# Patient Record
Sex: Female | Born: 1990 | Race: Black or African American | Hispanic: No | Marital: Single | Smoking: Never smoker
Health system: Southern US, Community
[De-identification: ages and names within clinical notes are randomized; demographics above are authoritative.]

## PROBLEM LIST (undated history)

## (undated) ENCOUNTER — Inpatient Hospital Stay (HOSPITAL_COMMUNITY): Payer: Self-pay

## (undated) DIAGNOSIS — I10 Essential (primary) hypertension: Secondary | ICD-10-CM

## (undated) DIAGNOSIS — K3580 Unspecified acute appendicitis: Secondary | ICD-10-CM

## (undated) DIAGNOSIS — N858 Other specified noninflammatory disorders of uterus: Secondary | ICD-10-CM

## (undated) DIAGNOSIS — R011 Cardiac murmur, unspecified: Secondary | ICD-10-CM

## (undated) HISTORY — DX: Essential (primary) hypertension: I10

## (undated) HISTORY — PX: APPENDECTOMY: SHX54

## (undated) HISTORY — DX: Cardiac murmur, unspecified: R01.1

---

## 2008-07-06 ENCOUNTER — Emergency Department (HOSPITAL_COMMUNITY): Admission: EM | Admit: 2008-07-06 | Discharge: 2008-07-06 | Payer: Self-pay | Admitting: Emergency Medicine

## 2009-08-25 ENCOUNTER — Emergency Department (HOSPITAL_COMMUNITY): Admission: EM | Admit: 2009-08-25 | Discharge: 2009-08-25 | Payer: Self-pay | Admitting: Emergency Medicine

## 2011-04-30 LAB — RAPID URINE DRUG SCREEN, HOSP PERFORMED
Amphetamines: NOT DETECTED
Benzodiazepines: NOT DETECTED
Tetrahydrocannabinol: POSITIVE — AB

## 2011-04-30 LAB — URINALYSIS, ROUTINE W REFLEX MICROSCOPIC
Protein, ur: 100 mg/dL — AB
Urobilinogen, UA: 1 mg/dL (ref 0.0–1.0)

## 2011-04-30 LAB — URINE MICROSCOPIC-ADD ON

## 2011-04-30 LAB — POCT PREGNANCY, URINE: Preg Test, Ur: NEGATIVE

## 2013-05-16 ENCOUNTER — Inpatient Hospital Stay: Admit: 2013-05-16 | Payer: Self-pay | Admitting: General Surgery

## 2013-05-16 ENCOUNTER — Encounter (HOSPITAL_COMMUNITY): Payer: Self-pay | Admitting: Emergency Medicine

## 2013-05-16 ENCOUNTER — Encounter (HOSPITAL_COMMUNITY): Payer: BC Managed Care – PPO | Admitting: Anesthesiology

## 2013-05-16 ENCOUNTER — Emergency Department (HOSPITAL_COMMUNITY): Payer: BC Managed Care – PPO

## 2013-05-16 ENCOUNTER — Emergency Department (HOSPITAL_COMMUNITY): Payer: BC Managed Care – PPO | Admitting: Anesthesiology

## 2013-05-16 ENCOUNTER — Observation Stay (HOSPITAL_COMMUNITY)
Admission: EM | Admit: 2013-05-16 | Discharge: 2013-05-17 | Disposition: A | Discharge status: Home / Self Care | Payer: BC Managed Care – PPO | Attending: General Surgery | Admitting: General Surgery

## 2013-05-16 ENCOUNTER — Encounter (HOSPITAL_COMMUNITY)
Admission: EM | Disposition: A | Discharge status: Home / Self Care | Payer: Self-pay | Source: Home / Self Care | Attending: Emergency Medicine

## 2013-05-16 DIAGNOSIS — K3580 Unspecified acute appendicitis: Principal | ICD-10-CM | POA: Insufficient documentation

## 2013-05-16 DIAGNOSIS — N858 Other specified noninflammatory disorders of uterus: Secondary | ICD-10-CM | POA: Diagnosis present

## 2013-05-16 DIAGNOSIS — R1031 Right lower quadrant pain: Secondary | ICD-10-CM | POA: Insufficient documentation

## 2013-05-16 DIAGNOSIS — N9489 Other specified conditions associated with female genital organs and menstrual cycle: Secondary | ICD-10-CM | POA: Insufficient documentation

## 2013-05-16 HISTORY — DX: Unspecified acute appendicitis: K35.80

## 2013-05-16 HISTORY — PX: LAPAROSCOPIC APPENDECTOMY: SHX408

## 2013-05-16 HISTORY — DX: Other specified noninflammatory disorders of uterus: N85.8

## 2013-05-16 LAB — CBC WITH DIFFERENTIAL/PLATELET
Eosinophils Relative: 1 % (ref 0–5)
HCT: 39.5 % (ref 36.0–46.0)
Lymphocytes Relative: 30 % (ref 12–46)
Lymphs Abs: 3.6 10*3/uL (ref 0.7–4.0)
MCV: 70.9 fL — ABNORMAL LOW (ref 78.0–100.0)
Monocytes Relative: 6 % (ref 3–12)
Neutro Abs: 7.6 10*3/uL (ref 1.7–7.7)
Platelets: 279 10*3/uL (ref 150–400)
RBC: 5.57 MIL/uL — ABNORMAL HIGH (ref 3.87–5.11)
WBC: 12 10*3/uL — ABNORMAL HIGH (ref 4.0–10.5)

## 2013-05-16 LAB — COMPREHENSIVE METABOLIC PANEL
ALT: 24 U/L (ref 0–35)
AST: 30 U/L (ref 0–37)
Albumin: 4 g/dL (ref 3.5–5.2)
Alkaline Phosphatase: 44 U/L (ref 39–117)
CO2: 22 mEq/L (ref 19–32)
Chloride: 97 mEq/L (ref 96–112)
Creatinine, Ser: 0.91 mg/dL (ref 0.50–1.10)
GFR calc non Af Amer: 88 mL/min — ABNORMAL LOW (ref >90)
Potassium: 3.9 mEq/L (ref 3.5–5.1)
Total Bilirubin: 0.2 mg/dL — ABNORMAL LOW (ref 0.3–1.2)

## 2013-05-16 LAB — URINALYSIS, ROUTINE W REFLEX MICROSCOPIC
Bilirubin Urine: NEGATIVE
Glucose, UA: NEGATIVE mg/dL
Hgb urine dipstick: NEGATIVE
Ketones, ur: 40 mg/dL — AB
Protein, ur: NEGATIVE mg/dL
pH: 6 (ref 5.0–8.0)

## 2013-05-16 LAB — URINE MICROSCOPIC-ADD ON

## 2013-05-16 SURGERY — APPENDECTOMY, LAPAROSCOPIC
Anesthesia: General | Site: Abdomen | Wound class: Clean Contaminated

## 2013-05-16 MED ORDER — LACTATED RINGERS IV SOLN
INTRAVENOUS | Status: DC | PRN
  Administered 2013-05-16: 23:00:00 via INTRAVENOUS

## 2013-05-16 MED ORDER — DEXAMETHASONE SODIUM PHOSPHATE 10 MG/ML IJ SOLN
INTRAMUSCULAR | Status: DC | PRN
  Administered 2013-05-16: 10 mg via INTRAVENOUS

## 2013-05-16 MED ORDER — MORPHINE SULFATE 4 MG/ML IJ SOLN
4.0000 mg | Freq: Once | INTRAMUSCULAR | Status: AC
  Administered 2013-05-16: 4 mg via INTRAVENOUS
  Filled 2013-05-16: qty 1

## 2013-05-16 MED ORDER — IOHEXOL 300 MG/ML  SOLN
50.0000 mL | Freq: Once | INTRAMUSCULAR | Status: AC | PRN
  Administered 2013-05-16: 50 mL via ORAL

## 2013-05-16 MED ORDER — MIDAZOLAM HCL 5 MG/5ML IJ SOLN
INTRAMUSCULAR | Status: DC | PRN
  Administered 2013-05-16: 2 mg via INTRAVENOUS

## 2013-05-16 MED ORDER — SODIUM CHLORIDE 0.9 % IV BOLUS (SEPSIS)
1000.0000 mL | Freq: Once | INTRAVENOUS | Status: AC
  Administered 2013-05-16: 1000 mL via INTRAVENOUS

## 2013-05-16 MED ORDER — ONDANSETRON HCL 4 MG/2ML IJ SOLN
INTRAMUSCULAR | Status: DC | PRN
  Administered 2013-05-16: 4 mg via INTRAVENOUS

## 2013-05-16 MED ORDER — 0.9 % SODIUM CHLORIDE (POUR BTL) OPTIME
TOPICAL | Status: DC | PRN
  Administered 2013-05-16: 1000 mL

## 2013-05-16 MED ORDER — LIDOCAINE HCL (CARDIAC) 20 MG/ML IV SOLN
INTRAVENOUS | Status: DC | PRN
  Administered 2013-05-16: 100 mg via INTRAVENOUS

## 2013-05-16 MED ORDER — SODIUM CHLORIDE 0.9 % IV SOLN
INTRAVENOUS | Status: AC
  Filled 2013-05-16: qty 1

## 2013-05-16 MED ORDER — BUPIVACAINE-EPINEPHRINE PF 0.5-1:200000 % IJ SOLN
INTRAMUSCULAR | Status: AC
  Filled 2013-05-16: qty 30

## 2013-05-16 MED ORDER — SUCCINYLCHOLINE CHLORIDE 20 MG/ML IJ SOLN
INTRAMUSCULAR | Status: DC | PRN
  Administered 2013-05-16: 100 mg via INTRAVENOUS

## 2013-05-16 MED ORDER — LACTATED RINGERS IR SOLN
Status: DC | PRN
  Administered 2013-05-16: 1000 mL

## 2013-05-16 MED ORDER — FENTANYL CITRATE 0.05 MG/ML IJ SOLN
INTRAMUSCULAR | Status: DC | PRN
  Administered 2013-05-16: 50 ug via INTRAVENOUS
  Administered 2013-05-16: 100 ug via INTRAVENOUS
  Administered 2013-05-16 (×2): 50 ug via INTRAVENOUS

## 2013-05-16 MED ORDER — IOHEXOL 300 MG/ML  SOLN
100.0000 mL | Freq: Once | INTRAMUSCULAR | Status: AC | PRN
  Administered 2013-05-16: 100 mL via INTRAVENOUS

## 2013-05-16 MED ORDER — ROCURONIUM BROMIDE 100 MG/10ML IV SOLN
INTRAVENOUS | Status: DC | PRN
  Administered 2013-05-16: 20 mg via INTRAVENOUS

## 2013-05-16 MED ORDER — PROPOFOL 10 MG/ML IV BOLUS
INTRAVENOUS | Status: DC | PRN
  Administered 2013-05-16: 200 mg via INTRAVENOUS

## 2013-05-16 SURGICAL SUPPLY — 38 items
APPLIER CLIP 5 13 M/L LIGAMAX5 (MISCELLANEOUS)
APPLIER CLIP ROT 10 11.4 M/L (STAPLE)
CANISTER SUCTION 2500CC (MISCELLANEOUS) ×2 IMPLANT
CHLORAPREP W/TINT 26ML (MISCELLANEOUS) ×2 IMPLANT
CLIP APPLIE 5 13 M/L LIGAMAX5 (MISCELLANEOUS) IMPLANT
CLIP APPLIE ROT 10 11.4 M/L (STAPLE) IMPLANT
CLOTH BEACON ORANGE TIMEOUT ST (SAFETY) ×2 IMPLANT
CORD HIGH FREQUENCY UNIPOLAR (ELECTROSURGICAL) ×2 IMPLANT
CUTTER FLEX LINEAR 45M (STAPLE) ×2 IMPLANT
DECANTER SPIKE VIAL GLASS SM (MISCELLANEOUS) ×2 IMPLANT
DERMABOND ADVANCED (GAUZE/BANDAGES/DRESSINGS)
DERMABOND ADVANCED .7 DNX12 (GAUZE/BANDAGES/DRESSINGS) IMPLANT
DRAPE LAPAROSCOPIC ABDOMINAL (DRAPES) ×2 IMPLANT
DRAPE UTILITY XL STRL (DRAPES) ×2 IMPLANT
ELECT REM PT RETURN 9FT ADLT (ELECTROSURGICAL) ×2
ELECTRODE REM PT RTRN 9FT ADLT (ELECTROSURGICAL) ×1 IMPLANT
GLOVE BIO SURGEON STRL SZ 6.5 (GLOVE) ×2 IMPLANT
GLOVE BIOGEL PI IND STRL 7.0 (GLOVE) ×1 IMPLANT
GLOVE BIOGEL PI INDICATOR 7.0 (GLOVE) ×1
GOWN STRL REIN 2XL XLG LVL4 (GOWN DISPOSABLE) ×2 IMPLANT
GOWN STRL REIN XL XLG (GOWN DISPOSABLE) ×4 IMPLANT
IV LACTATED RINGERS 1000ML (IV SOLUTION) ×2 IMPLANT
KIT BASIN OR (CUSTOM PROCEDURE TRAY) ×2 IMPLANT
POUCH SPECIMEN RETRIEVAL 10MM (ENDOMECHANICALS) ×2 IMPLANT
RELOAD 45 VASCULAR/THIN (ENDOMECHANICALS) ×2 IMPLANT
RELOAD STAPLE TA45 3.5 REG BLU (ENDOMECHANICALS) ×2 IMPLANT
SCALPEL HARMONIC ACE (MISCELLANEOUS) IMPLANT
SET IRRIG TUBING LAPAROSCOPIC (IRRIGATION / IRRIGATOR) ×2 IMPLANT
SOLUTION ANTI FOG 6CC (MISCELLANEOUS) ×2 IMPLANT
SUT MNCRL AB 4-0 PS2 18 (SUTURE) ×2 IMPLANT
SUT VIC AB 4-0 PS2 27 (SUTURE) ×2 IMPLANT
TOWEL OR 17X26 10 PK STRL BLUE (TOWEL DISPOSABLE) ×2 IMPLANT
TRAY FOLEY CATH 14FRSI W/METER (CATHETERS) ×2 IMPLANT
TRAY LAP CHOLE (CUSTOM PROCEDURE TRAY) ×2 IMPLANT
TROCAR BLADELESS OPT 5 75 (ENDOMECHANICALS) ×4 IMPLANT
TROCAR XCEL 12X100 BLDLESS (ENDOMECHANICALS) IMPLANT
TROCAR XCEL BLUNT TIP 100MML (ENDOMECHANICALS) ×2 IMPLANT
TUBING INSUFFLATION 10FT LAP (TUBING) ×2 IMPLANT

## 2013-05-16 NOTE — ED Notes (Signed)
OR called that pt is now ready for transport, stated she will be sending for her shortly.

## 2013-05-16 NOTE — H&P (Addendum)
Annelise Mccoy is an 22 y.o. female.   Chief Complaint: RLQ abd pain HPI: Patient presents to the emergency department with pain in her lower abdomen since Sunday. She reports his pain is worse when she is lying down. She felt bloated on Sunday and Monday and tried to relieve this with laxatives. She did have several bowel movements but had no relief in her symptoms.  She denies any fevers, nausea, vomiting, diarrhea or constipation. She denies any urinary symptoms. She states that the pain is now localized to her right lower quadrant.  No past medical history  No past surgical history   No significant family history Social History:  reports that she has never smoked. She does not have any smokeless tobacco history on file. She reports that she drinks about 1.8 ounces of alcohol per week. She reports that she does not use illicit drugs.  Allergies: No Known Allergies   (Not in a hospital admission)  Results for orders placed during the hospital encounter of 05/16/13 (from the past 48 hour(s))  CBC WITH DIFFERENTIAL     Status: Abnormal   Collection Time    05/16/13  7:50 PM      Result Value Range   WBC 12.0 (*) 4.0 - 10.5 K/uL   RBC 5.57 (*) 3.87 - 5.11 MIL/uL   Hemoglobin 13.1  12.0 - 15.0 g/dL   HCT 40.9  81.1 - 91.4 %   MCV 70.9 (*) 78.0 - 100.0 fL   MCH 23.5 (*) 26.0 - 34.0 pg   MCHC 33.2  30.0 - 36.0 g/dL   RDW 78.2  95.6 - 21.3 %   Platelets 279  150 - 400 K/uL   Neutrophils Relative % 63  43 - 77 %   Lymphocytes Relative 30  12 - 46 %   Monocytes Relative 6  3 - 12 %   Eosinophils Relative 1  0 - 5 %   Basophils Relative 0  0 - 1 %   Neutro Abs 7.6  1.7 - 7.7 K/uL   Lymphs Abs 3.6  0.7 - 4.0 K/uL   Monocytes Absolute 0.7  0.1 - 1.0 K/uL   Eosinophils Absolute 0.1  0.0 - 0.7 K/uL   Basophils Absolute 0.0  0.0 - 0.1 K/uL   Smear Review MORPHOLOGY UNREMARKABLE    COMPREHENSIVE METABOLIC PANEL     Status: Abnormal   Collection Time    05/16/13  7:50 PM      Result Value  Range   Sodium 132 (*) 135 - 145 mEq/L   Potassium 3.9  3.5 - 5.1 mEq/L   Chloride 97  96 - 112 mEq/L   CO2 22  19 - 32 mEq/L   Glucose, Bld 87  70 - 99 mg/dL   BUN 13  6 - 23 mg/dL   Creatinine, Ser 0.86  0.50 - 1.10 mg/dL   Calcium 57.8  8.4 - 46.9 mg/dL   Total Protein 8.1  6.0 - 8.3 g/dL   Albumin 4.0  3.5 - 5.2 g/dL   AST 30  0 - 37 U/L   ALT 24  0 - 35 U/L   Alkaline Phosphatase 44  39 - 117 U/L   Total Bilirubin 0.2 (*) 0.3 - 1.2 mg/dL   GFR calc non Af Amer 88 (*) >90 mL/min   GFR calc Af Amer >90  >90 mL/min   Comment: (NOTE)     The eGFR has been calculated using the CKD EPI equation.  This calculation has not been validated in all clinical situations.     eGFR's persistently <90 mL/min signify possible Chronic Kidney     Disease.  LIPASE, BLOOD     Status: None   Collection Time    05/16/13  7:50 PM      Result Value Range   Lipase 28  11 - 59 U/L  URINALYSIS, ROUTINE W REFLEX MICROSCOPIC     Status: Abnormal   Collection Time    05/16/13  7:51 PM      Result Value Range   Color, Urine YELLOW  YELLOW   APPearance CLEAR  CLEAR   Specific Gravity, Urine 1.015  1.005 - 1.030   pH 6.0  5.0 - 8.0   Glucose, UA NEGATIVE  NEGATIVE mg/dL   Hgb urine dipstick NEGATIVE  NEGATIVE   Bilirubin Urine NEGATIVE  NEGATIVE   Ketones, ur 40 (*) NEGATIVE mg/dL   Protein, ur NEGATIVE  NEGATIVE mg/dL   Urobilinogen, UA 0.2  0.0 - 1.0 mg/dL   Nitrite NEGATIVE  NEGATIVE   Leukocytes, UA TRACE (*) NEGATIVE  URINE MICROSCOPIC-ADD ON     Status: None   Collection Time    05/16/13  7:51 PM      Result Value Range   Squamous Epithelial / LPF RARE  RARE   WBC, UA 0-2  <3 WBC/hpf   RBC / HPF 0-2  <3 RBC/hpf   Bacteria, UA RARE  RARE  POCT PREGNANCY, URINE     Status: None   Collection Time    05/16/13  7:58 PM      Result Value Range   Preg Test, Ur NEGATIVE  NEGATIVE   Comment:            THE SENSITIVITY OF THIS     METHODOLOGY IS >24 mIU/mL   Ct Abdomen Pelvis W  Contrast  05/16/2013   CLINICAL DATA:  Lower abdominal pain 3 days  EXAM: CT ABDOMEN AND PELVIS WITH CONTRAST  TECHNIQUE: Multidetector CT imaging of the abdomen and pelvis was performed using the standard protocol following bolus administration of intravenous contrast.  CONTRAST:  OMNIPAQUE IOHEXOL 300 MG/ML  SOLN  COMPARISON:  None.  FINDINGS: Image quality degraded by motion.  FINDINGS consistent with acute appendicitis. The appendix is edematous and dilated measuring 10 mm. Appendicolith is present in the tip of the appendix. The appendix projects medial to the cecum with the tip in the midline. No abscess or evidence of perforation. Minimal free fluid in the cul de sac.  Negative for bowel obstruction.  The lung bases are clear. Liver gallbladder and bile ducts are normal. Pancreas and spleen are normal.  IMPRESSION: Findings compatible with acute appendicitis with appendicolith in the tip of the appendix. Negative for abscess.   Electronically Signed   By: Marlan Palau M.D.   On: 05/16/2013 21:24    Review of Systems  Constitutional: Negative for fever and chills.  Respiratory: Negative for shortness of breath.   Cardiovascular: Negative for chest pain.  Gastrointestinal: Positive for abdominal pain. Negative for nausea, vomiting, diarrhea and constipation.  Genitourinary: Negative for dysuria, urgency and frequency.  Skin: Negative for rash.  Neurological: Negative for headaches.    Blood pressure 127/67, pulse 101, temperature 98.3 F (36.8 C), temperature source Oral, last menstrual period 05/02/2013, SpO2 97.00%. Physical Exam  Constitutional: She is oriented to person, place, and time. She appears well-developed and well-nourished. No distress.  HENT:  Head: Normocephalic and atraumatic.  Eyes:  Conjunctivae are normal. Pupils are equal, round, and reactive to light.  Neck: Normal range of motion.  Cardiovascular: Normal rate and regular rhythm.   Respiratory: Effort normal  and breath sounds normal. No respiratory distress.  GI: Soft. She exhibits no distension. There is tenderness. There is no rebound and no guarding.  RLQ  Musculoskeletal: Normal range of motion.  Neurological: She is alert and oriented to person, place, and time.  Skin: She is not diaphoretic.     Assessment/Plan Yanel Dombrosky is a 23 year old female who presents to the emergency department with right lower quadrant pain. A CT scan shows signs concerning for appendicitis. The risks and benefits of laparoscopic appendectomy were explained to the patient. The main risk include bleeding, infection, damage to adjacent structures, wound infection and hernia.  All questions were answered. I believe she understands this and has agreed to proceed with surgery.  Juandedios Dudash C. 05/16/2013, 10:22 PM

## 2013-05-16 NOTE — Anesthesia Preprocedure Evaluation (Addendum)
Anesthesia Evaluation  Patient identified by MRN, date of birth, ID band Patient awake    Reviewed: Allergy & Precautions, H&P , NPO status , Patient's Chart, lab work & pertinent test results  Airway Mallampati: II TM Distance: >3 FB Neck ROM: Full    Dental no notable dental hx.    Pulmonary neg pulmonary ROS,  breath sounds clear to auscultation  Pulmonary exam normal       Cardiovascular Exercise Tolerance: Good negative cardio ROS  Rhythm:Regular Rate:Normal     Neuro/Psych negative neurological ROS  negative psych ROS   GI/Hepatic negative GI ROS, Neg liver ROS,   Endo/Other  negative endocrine ROS  Renal/GU negative Renal ROS  negative genitourinary   Musculoskeletal negative musculoskeletal ROS (+)   Abdominal   Peds negative pediatric ROS (+)  Hematology negative hematology ROS (+)   Anesthesia Other Findings   Reproductive/Obstetrics Negative pregnancy test.                           Anesthesia Physical Anesthesia Plan  ASA: II and emergent  Anesthesia Plan: General   Post-op Pain Management:    Induction: Intravenous  Airway Management Planned: Oral ETT  Additional Equipment:   Intra-op Plan:   Post-operative Plan: Extubation in OR  Informed Consent: I have reviewed the patients History and Physical, chart, labs and discussed the procedure including the risks, benefits and alternatives for the proposed anesthesia with the patient or authorized representative who has indicated his/her understanding and acceptance.   Dental advisory given  Plan Discussed with: CRNA  Anesthesia Plan Comments:         Anesthesia Quick Evaluation

## 2013-05-16 NOTE — ED Notes (Signed)
x2 unsuccessful IV attempts, another RN to attempt IV at this time

## 2013-05-16 NOTE — ED Provider Notes (Signed)
CSN: [id number]     Arrival date & time 05/16/13  1849 History   First MD Initiated Contact with Patient 05/16/13 1922     Chief Complaint  Patient presents with  . Abdominal Pain   (Consider location/radiation/quality/duration/timing/severity/associated sxs/prior Treatment) HPI Comments: 22 y/o woman comes in with cc of abd pain. Abd pain x 3 days, now constant, diffusely located, but worst right below the umbilicus and right side. The pain is non radiating, and not around the flank area. There is no emesis, no diarrhea, no uti like sx, no hx of renal stones and no vaginal d/c or bleeding.  Patient is a 22 y.o. female presenting with abdominal pain. The history is provided by the patient.  Abdominal Pain Associated symptoms: no chest pain, no dysuria, no nausea, no shortness of breath, no vaginal bleeding, no vaginal discharge and no vomiting     No past medical history on file. No past surgical history on file. No family history on file. History  Substance Use Topics  . Smoking status: Never Smoker   . Smokeless tobacco: Not on file  . Alcohol Use: 1.8 oz/week    3 Cans of beer per week   OB History   Grav Para Term Preterm Abortions TAB SAB Ect Mult Living                 Review of Systems  Constitutional: Negative for activity change.  Respiratory: Negative for shortness of breath.   Cardiovascular: Negative for chest pain.  Gastrointestinal: Positive for abdominal pain. Negative for nausea and vomiting.  Genitourinary: Negative for dysuria, frequency, flank pain, vaginal bleeding, vaginal discharge and pelvic pain.  Musculoskeletal: Negative for neck pain.  Neurological: Negative for headaches.    Allergies  Review of patient's allergies indicates no known allergies.  Home Medications   Current Outpatient Rx  Name  Route  Sig  Dispense  Refill  . calcium carbonate (TUMS - DOSED IN MG ELEMENTAL CALCIUM) 500 MG chewable tablet   Oral   Chew 1 tablet by mouth  daily.         . diphenhydrAMINE (BENADRYL) 25 mg capsule   Oral   Take 25 mg by mouth every 6 (six) hours as needed for itching.         Marland Kitchen ibuprofen (ADVIL,MOTRIN) 200 MG tablet   Oral   Take 200 mg by mouth every 6 (six) hours as needed for pain.         Marland Kitchen norethindrone-ethinyl estradiol (JUNEL FE,GILDESS FE,LOESTRIN FE) 1-20 MG-MCG tablet   Oral   Take 1 tablet by mouth daily.         . Sennosides (EX-LAX PO)   Oral   Take by mouth.          BP 144/83  Pulse 72  Temp(Src) 98.3 F (36.8 C) (Oral)  SpO2 100%  LMP 05/02/2013 Physical Exam  Nursing note and vitals reviewed. Constitutional: She is oriented to person, place, and time. She appears well-developed and well-nourished.  HENT:  Head: Normocephalic and atraumatic.  Eyes: EOM are normal. Pupils are equal, round, and reactive to light.  Neck: Neck supple.  Cardiovascular: Normal rate, regular rhythm and normal heart sounds.   No murmur heard. Pulmonary/Chest: Effort normal. No respiratory distress.  Abdominal: Soft. She exhibits no distension. There is tenderness. There is no rebound and no guarding.  RLQ, suprapubic and LLQ tenderness, with voluntary guarding, no rebound tenderness  Neurological: She is alert and oriented to person, place,  and time.  Skin: Skin is warm and dry.    ED Course  Procedures (including critical care time) Labs Review Labs Reviewed  CBC WITH DIFFERENTIAL - Abnormal; Notable for the following:    WBC 12.0 (*)    RBC 5.57 (*)    MCV 70.9 (*)    MCH 23.5 (*)    All other components within normal limits  COMPREHENSIVE METABOLIC PANEL - Abnormal; Notable for the following:    Sodium 132 (*)    Total Bilirubin 0.2 (*)    GFR calc non Af Amer 88 (*)    All other components within normal limits  URINALYSIS, ROUTINE W REFLEX MICROSCOPIC - Abnormal; Notable for the following:    Ketones, ur 40 (*)    Leukocytes, UA TRACE (*)    All other components within normal limits   LIPASE, BLOOD  URINE MICROSCOPIC-ADD ON  POCT PREGNANCY, URINE   Imaging Review No results found.  EKG Interpretation   None       MDM  No diagnosis found.  Pt comes in with cc of abd pain. Pt has lower quadrant pain, no vaginal d/c, bleeding and no uti like sx. She has no hx of STD, and has no risk factors at this time. Exam reveals diffse lower quadrant tenderness with guarding but no rebound. Possible appendicitis, or ruptured appy given pain is diffuse and there eis guarding. CT ordered - which if negative, we will get pelvic exam. No concerns for ovarian torsion for this pain x 3 days.  Derwood Kaplan, MD 05/16/13 2120

## 2013-05-16 NOTE — ED Notes (Signed)
Pt states that she started having lower abdominal pain 3 days ago. Denies N/V/D.

## 2013-05-16 NOTE — ED Notes (Signed)
MD at bedside at this time, speaking to pt

## 2013-05-17 ENCOUNTER — Encounter: Payer: Self-pay | Admitting: General Surgery

## 2013-05-17 ENCOUNTER — Encounter (HOSPITAL_COMMUNITY): Payer: Self-pay | Admitting: *Deleted

## 2013-05-17 DIAGNOSIS — N858 Other specified noninflammatory disorders of uterus: Secondary | ICD-10-CM

## 2013-05-17 DIAGNOSIS — K3580 Unspecified acute appendicitis: Secondary | ICD-10-CM | POA: Diagnosis present

## 2013-05-17 HISTORY — DX: Unspecified acute appendicitis: K35.80

## 2013-05-17 HISTORY — DX: Other specified noninflammatory disorders of uterus: N85.8

## 2013-05-17 MED ORDER — BUPIVACAINE-EPINEPHRINE 0.5% -1:200000 IJ SOLN
INTRAMUSCULAR | Status: DC | PRN
  Administered 2013-05-17: 11 mL

## 2013-05-17 MED ORDER — ONDANSETRON HCL 4 MG PO TABS: 4.0000 mg | ORAL_TABLET | Freq: Four times a day (QID) | ORAL | Status: DC | PRN

## 2013-05-17 MED ORDER — PROMETHAZINE HCL 25 MG/ML IJ SOLN: 6.2500 mg | INTRAMUSCULAR | Status: DC | PRN

## 2013-05-17 MED ORDER — OXYCODONE-ACETAMINOPHEN 5-325 MG PO TABS
1.0000 | ORAL_TABLET | ORAL | Status: DC | PRN
  Administered 2013-05-17: 1 via ORAL
  Administered 2013-05-17: 2 via ORAL
  Administered 2013-05-17: 1 via ORAL
  Filled 2013-05-17 (×2): qty 1
  Filled 2013-05-17: qty 2

## 2013-05-17 MED ORDER — HYDROMORPHONE HCL PF 1 MG/ML IJ SOLN
0.2500 mg | INTRAMUSCULAR | Status: DC | PRN
  Administered 2013-05-17 (×2): 0.5 mg via INTRAVENOUS

## 2013-05-17 MED ORDER — FENTANYL CITRATE 0.05 MG/ML IJ SOLN: 25.0000 ug | INTRAMUSCULAR | Status: DC | PRN

## 2013-05-17 MED ORDER — IBUPROFEN 200 MG PO TABS: ORAL_TABLET | ORAL | Status: DC

## 2013-05-17 MED ORDER — SODIUM CHLORIDE 0.9 % IV SOLN
INTRAVENOUS | Status: DC
  Administered 2013-05-17: 01:00:00 via INTRAVENOUS

## 2013-05-17 MED ORDER — MORPHINE SULFATE 2 MG/ML IJ SOLN
2.0000 mg | INTRAMUSCULAR | Status: DC | PRN
  Administered 2013-05-17: 2 mg via INTRAVENOUS
  Filled 2013-05-17: qty 1

## 2013-05-17 MED ORDER — SODIUM CHLORIDE 0.9 % IV SOLN
1.0000 g | INTRAVENOUS | Status: DC | PRN
  Administered 2013-05-16: 1 g via INTRAVENOUS

## 2013-05-17 MED ORDER — GLYCOPYRROLATE 0.2 MG/ML IJ SOLN
INTRAMUSCULAR | Status: DC | PRN
  Administered 2013-05-17: 0.6 mg via INTRAVENOUS

## 2013-05-17 MED ORDER — OXYCODONE-ACETAMINOPHEN 5-325 MG PO TABS: 1.0000 | ORAL_TABLET | ORAL | Status: DC | PRN

## 2013-05-17 MED ORDER — ENOXAPARIN SODIUM 40 MG/0.4ML ~~LOC~~ SOLN
40.0000 mg | SUBCUTANEOUS | Status: DC
  Filled 2013-05-17: qty 0.4

## 2013-05-17 MED ORDER — ACETAMINOPHEN 325 MG PO TABS: 650.0000 mg | ORAL_TABLET | Freq: Four times a day (QID) | ORAL | Status: DC | PRN

## 2013-05-17 MED ORDER — ONDANSETRON HCL 4 MG/2ML IJ SOLN: 4.0000 mg | Freq: Four times a day (QID) | INTRAMUSCULAR | Status: DC | PRN

## 2013-05-17 MED ORDER — NEOSTIGMINE METHYLSULFATE 1 MG/ML IJ SOLN
INTRAMUSCULAR | Status: DC | PRN
  Administered 2013-05-17: 4 mg via INTRAVENOUS

## 2013-05-17 MED ORDER — HYDROMORPHONE HCL PF 1 MG/ML IJ SOLN
INTRAMUSCULAR | Status: AC
  Filled 2013-05-17: qty 1

## 2013-05-17 NOTE — Op Note (Signed)
Casey Schroeder [id number]   PRE-OPERATIVE DIAGNOSIS:  acute appendicitis  POST-OPERATIVE DIAGNOSIS:  acute appendicitis, uterine mass  PROCEDURE:  Procedure(s): APPENDECTOMY LAPAROSCOPIC Biopsy of Uterine mass  SURGEON:  Surgeon(s): Romie Levee, MD  ASSISTANT: none   ANESTHESIA:   local and general  EBL:   10ml  Delay start of Pharmacological VTE agent (>24hrs) due to surgical blood loss or risk of bleeding:  no  DRAINS: none   SPECIMEN:  Source of Specimen:  appendix, uterine mass  DISPOSITION OF SPECIMEN:  PATHOLOGY  COUNTS:  YES  PLAN OF CARE: Admit for overnight observation  PATIENT DISPOSITION:  PACU - hemodynamically stable.   INDICATIONS: Patient with concerning symptoms & work up suspicious for appendicitis.  Surgery was recommended:  The anatomy & physiology of the digestive tract was discussed.  The pathophysiology of appendicitis was discussed.  Natural history risks without surgery was discussed.   I feel the risks of no intervention will lead to serious problems that outweigh the operative risks; therefore, I recommended diagnostic laparoscopy with removal of appendix to remove the pathology.  Laparoscopic & open techniques were discussed.   I noted a good likelihood this will help address the problem.    Risks such as bleeding, infection, abscess, leak, reoperation, possible ostomy, hernia, heart attack, death, and other risks were discussed.  Goals of post-operative recovery were discussed as well.  We will work to minimize complications.  Questions were answered.  The patient expresses understanding & wishes to proceed with surgery.  OR FINDINGS: the patient had an acutely inflamed appendix distally.  There was a uterine mass noted, which was biopsied.  DESCRIPTION:   The patient was identified & brought into the operating room. The patient was positioned supine with left arm tucked. SCDs were active during the entire case. The patient underwent general  anesthesia without any difficulty.  A foley catheter was inserted under sterile conditions. The abdomen was prepped and draped in a sterile fashion. A Surgical Timeout confirmed our plan.   I made a transverse incision through the inferior umbilical fold.  I made a nick in the infraumbilical fascia and confirmed peritoneal entry.  I placed a stay suture and then the [facility] port.  We induced carbon dioxide insufflation.  Camera inspection revealed no injury.  I placed additional ports under direct laparoscopic visualization.  I mobilized the terminal ileum to proximal ascending colon in a lateral to medial fashion.  I took care to avoid injuring any retroperitoneal structures.   I freed the appendix off its attachments to the ascending colon and cecal mesentery.  I elevated the appendix. I skeletonized & ligated the mesoappendix using a white load stapler. I was able to free off the base of the appendix which was still viable.  I stapled the appendix off the cecum using a laparoscopic stapler blue load. I took a healthy cuff viable cecum.  I placed the appendix inside an EndoCatch bag and removed out the [place] port.  I did copious irrigation. Hemostasis was good in the mesoappendix, colon mesentery, and retroperitoneum. Staple line was intact on the cecum with no bleeding. I washed out the pelvis, retrohepatic space and right paracolic gutter. I washed out the left side as well.  Hemostasis is good. There was no perforation or injury.  Because the area cleaned up well after irrigation, I did not place a drain.  I turned my attention to the uterine mass. We took a laparoscopic picture. This did not appear to be a standard  fibroid tumor. I decided to take a small biopsy using the biopsy forceps. Hemostasis was achieved using electrocautery.  I aspirated the carbon dioxide. I removed the ports. I closed the umbilical fascia site using a 0 Vicryl stitch. I closed skin using 4-0 vicryl stitch.  Sterile  dressings were applied.  Patient was extubated and sent to the recovery room.  I discussed the operative findings with the patient's family. I suspect the patient is going used in the hospital at least overnight and will need antibiotics for 0 days. Questions answered. They expressed understanding and appreciation.

## 2013-05-17 NOTE — Care Management Note (Signed)
    Page 1 of 1   05/17/2013     11:57:27 AM   CARE MANAGEMENT NOTE 05/17/2013  Patient:  Casey Schroeder   Account Number:  [id number]  Date Initiated:  05/17/2013  Documentation initiated by:  Lorenda Ishihara  Subjective/Objective Assessment:   22 yo female admitted s/p lap appy and uterine biopsy. PTA lived at home with spouse.     Action/Plan:   Home when stable   Anticipated DC Date:  05/17/2013   Anticipated DC Plan:  HOME/SELF CARE      DC Planning Services  CM consult      Choice offered to / List presented to:             Status of service:  Completed, signed off Medicare Important Message given?   (If response is "NO", the following Medicare IM given date fields will be blank) Date Medicare IM given:   Date Additional Medicare IM given:    Discharge Disposition:  HOME/SELF CARE  Per UR Regulation:  Reviewed for med. necessity/level of care/duration of stay  If discussed at Long Length of Stay Meetings, dates discussed:    Comments:

## 2013-05-17 NOTE — Discharge Summary (Signed)
I have interviewed and examined this patient today. I agree with the assessment and discharge planning and followup outlined by Mr. Marlyne Beards, [place]  The patient will followup with Dr. Romie Levee in the office.  Angelia Mould. Derrell Lolling, M.D., [facility] General and Minimally invasive Surgery Breast and Colorectal Surgery Office:   [phone] Pager:   [phone]

## 2013-05-17 NOTE — Discharge Summary (Signed)
Physician Discharge Summary  Patient ID: Casey Schroeder MRN: [id number] DOB/AGE: 1991/05/01 22 y.o.  Admit date: 05/16/2013 Discharge date: 05/17/2013  Admission Diagnoses: acute appendicitis  Discharge Diagnoses: acute appendicitis, uterine mass  Active Problems:   Appendicitis, acute   Uterine mass   PROCEDURES:  APPENDECTOMY LAPAROSCOPIC, Biopsy of Uterine mass,05/17/2013, Romie Levee, MD.   Hospital Course: Patient presents to the emergency department with pain in her lower abdomen since Sunday. She reports his pain is worse when she is lying down. She felt bloated on Sunday and Monday and tried to relieve this with laxatives. She did have several bowel movements but had no relief in her symptoms. She denies any fevers, nausea, vomiting, diarrhea or constipation. She denies any urinary symptoms. She states that the pain is now localized to her right lower quadrant.  CT scan showed acute appendicitis, she was seen by Dr. Maisie Fus and taken to the OR that evening. She underwent the above procedure, with a mass noted.  She underwent the above procedure and returned to the floor. She has done well post op and we plan to let her go home today.  Path will be reviewed and Dr. Maisie Fus will talk with her when that is available.   She will follow up with Dr. Maisie Fus in 2 weeks in our office.   Condition on d/c:  Improving.   Disposition: Final discharge disposition not confirmed     Medication List         acetaminophen 325 MG tablet  Commonly known as:  TYLENOL  Take 2 tablets (650 mg total) by mouth every 6 (six) hours as needed for pain.     calcium carbonate 500 MG chewable tablet  Commonly known as:  TUMS - dosed in mg elemental calcium  Chew 1 tablet by mouth daily.     diphenhydrAMINE 25 mg capsule  Commonly known as:  BENADRYL  Take 25 mg by mouth every 6 (six) hours as needed for itching.     EX-LAX PO  Take by mouth.     ibuprofen 200 MG tablet  Commonly known as:   ADVIL,MOTRIN  You can take 2-3 tablets every 6 hours as needed for pain.     norethindrone-ethinyl estradiol 1-20 MG-MCG tablet  Commonly known as:  JUNEL FE,GILDESS FE,LOESTRIN FE  Take 1 tablet by mouth daily.     oxyCODONE-acetaminophen 5-325 MG per tablet  Commonly known as:  PERCOCET/ROXICET  Take 1-2 tablets by mouth every 4 (four) hours as needed.       Follow-up Information   Follow up with Vanita Panda., MD.   Specialty:  General Surgery   Contact information:   [address] [place] [place] [address] [phone]       Signed: Sherrie George 05/17/2013, 12:55 PM

## 2013-05-17 NOTE — Progress Notes (Signed)
1 Day Post-Op  Subjective: Walking some but sore, ate some breakfast.  Pretty sore this AM.  Objective: Vital signs in last 24 hours: Temp:  [97.4 F (36.3 C)-99 F (37.2 C)] 98.5 F (36.9 C) (10/23 0600) Pulse Rate:  [63-101] 82 (10/23 0600) Resp:  [10-20] 16 (10/23 0600) BP: (110-144)/(58-83) 116/62 mmHg (10/23 0600) SpO2:  [97 %-100 %] 100 % (10/23 0600) Weight:  [87.998 kg (194 lb)] 87.998 kg (194 lb) (10/23 0158) Last BM Date: 05/16/13 240 PO, Regular Diet Tm 99, VSS,   Intake/Output from previous day: 10/22 0701 - 10/23 0700 In: 1665 [P.O.:240; I.V.:1425] Out: 400 [Urine:400] Intake/Output this shift:    General appearance: alert, cooperative and no distress GI: sore, incisions look fine, tolerating PO's.  Lab Results:   Recent Labs  05/16/13 1950  WBC 12.0*  HGB 13.1  HCT 39.5  PLT 279    BMET  Recent Labs  05/16/13 1950  NA 132*  K 3.9  CL 97  CO2 22  GLUCOSE 87  BUN 13  CREATININE 0.91  CALCIUM 10.1   PT/INR No results found for this basename: LABPROT, INR,  in the last 72 hours   Recent Labs Lab 05/16/13 1950  AST 30  ALT 24  ALKPHOS 44  BILITOT 0.2*  PROT 8.1  ALBUMIN 4.0     Lipase     Component Value Date/Time   LIPASE 28 05/16/2013 1950     Studies/Results: Ct Abdomen Pelvis W Contrast  05/16/2013   CLINICAL DATA:  Lower abdominal pain 3 days  EXAM: CT ABDOMEN AND PELVIS WITH CONTRAST  TECHNIQUE: Multidetector CT imaging of the abdomen and pelvis was performed using the standard protocol following bolus administration of intravenous contrast.  CONTRAST:  OMNIPAQUE IOHEXOL 300 MG/ML  SOLN  COMPARISON:  None.  FINDINGS: Image quality degraded by motion.  FINDINGS consistent with acute appendicitis. The appendix is edematous and dilated measuring 10 mm. Appendicolith is present in the tip of the appendix. The appendix projects medial to the cecum with the tip in the midline. No abscess or evidence of perforation. Minimal  free fluid in the cul de sac.  Negative for bowel obstruction.  The lung bases are clear. Liver gallbladder and bile ducts are normal. Pancreas and spleen are normal.  IMPRESSION: Findings compatible with acute appendicitis with appendicolith in the tip of the appendix. Negative for abscess.   Electronically Signed   By: Marlan Palau M.D.   On: 05/16/2013 21:24    Medications: . enoxaparin (LOVENOX) injection  40 mg Subcutaneous Q24H  . HYDROmorphone        Assessment/Plan acute appendicitis, uterine mass APPENDECTOMY LAPAROSCOPIC, Biopsy of Uterine mass,05/17/2013, Romie Levee, MD.   Plan:  If she does well home after lunch.   Follow up with Dr. Maisie Fus.       LOS: 1 day    Casey Schroeder 05/17/2013

## 2013-05-17 NOTE — Transfer of Care (Signed)
Immediate Anesthesia Transfer of Care Note  Patient: Casey Schroeder  Procedure(s) Performed: Procedure(s): APPENDECTOMY LAPAROSCOPIC uterine biopsy (N/A)  Patient Location: PACU  Anesthesia Type:General  Level of Consciousness: sedated  Airway & Oxygen Therapy: Patient Spontanous Breathing and Patient connected to face mask oxygen  Post-op Assessment: Report given to PACU RN and Post -op Vital signs reviewed and stable  Post vital signs: Reviewed and stable  Complications: No apparent anesthesia complications

## 2013-05-17 NOTE — Anesthesia Postprocedure Evaluation (Signed)
  Anesthesia Post-op Note  Patient: Casey Schroeder  Procedure(s) Performed: Procedure(s) (LRB): APPENDECTOMY LAPAROSCOPIC uterine biopsy (N/A)  Patient Location: PACU  Anesthesia Type: General  Level of Consciousness: awake and alert   Airway and Oxygen Therapy: Patient Spontanous Breathing  Post-op Pain: mild  Post-op Assessment: Post-op Vital signs reviewed, Patient's Cardiovascular Status Stable, Respiratory Function Stable, Patent Airway and No signs of Nausea or vomiting  Last Vitals:  Filed Vitals:   05/17/13 0030  BP:   Pulse:   Temp: 36.3 C    Post-op Vital Signs: stable   Complications: No apparent anesthesia complications

## 2013-05-17 NOTE — Progress Notes (Signed)
I have interviewed and evaluated and examined this patient this morning. I agree with the assessment and treatment plan outlined by Mr. Marlyne Beards, [place].  I did discuss the operative findings with her and the need to followup in the office with Dr. Maisie Fus to discuss the pathology report of the appendix and the uterine biopsy.  Angelia Mould. Derrell Lolling, M.D., [facility] General and Minimally invasive Surgery Breast and Colorectal Surgery Office:   [phone] Pager:   [phone]

## 2013-05-18 ENCOUNTER — Telehealth (INDEPENDENT_AMBULATORY_CARE_PROVIDER_SITE_OTHER): Payer: Self-pay

## 2013-05-18 NOTE — Telephone Encounter (Signed)
I called and left a message for the pt to call.  I was calling to check on her and see how she is postop and to schedule a follow up appointment.  Dr Maisie Fus has time on 11/10

## 2013-06-04 ENCOUNTER — Ambulatory Visit (INDEPENDENT_AMBULATORY_CARE_PROVIDER_SITE_OTHER): Payer: BC Managed Care – PPO | Admitting: General Surgery

## 2013-06-04 ENCOUNTER — Encounter (INDEPENDENT_AMBULATORY_CARE_PROVIDER_SITE_OTHER): Payer: Self-pay | Admitting: General Surgery

## 2013-06-04 ENCOUNTER — Encounter (INDEPENDENT_AMBULATORY_CARE_PROVIDER_SITE_OTHER): Payer: Self-pay

## 2013-06-04 VITALS — BP 106/62 | HR 72 | Temp 98.2°F | Resp 15 | Ht 64.0 in | Wt 197.4 lb

## 2013-06-04 DIAGNOSIS — Z9889 Other specified postprocedural states: Secondary | ICD-10-CM

## 2013-06-04 NOTE — Progress Notes (Signed)
Casey Schroeder is a 22 y.o. female who is status post a lap appendectomy on 10/23.  She is doing well.  She is having a small amt of irritation at her umbilical incision.  She is having regular BM's and tolerating a diet fine.    Objective: Filed Vitals:   06/04/13 1502  BP: 106/62  Pulse: 72  Temp: 98.2 F (36.8 C)  Resp: 15    General appearance: alert and cooperative GI: normal findings: soft, non-tender  Incision: healing well   Assessment: s/p  Patient Active Problem List   Diagnosis Date Noted  . Appendicitis, acute 05/17/2013  . Uterine mass 05/17/2013    Plan: Reviewed path.  PT to discuss external uterine fibroid with her Gyn.  Ok to return to activity.  F/U PRN    .Vanita Panda, MD [facility], [place] [phone]   06/04/2013 3:17 PM

## 2013-06-04 NOTE — Patient Instructions (Signed)
Ok to increase your activity level.  Hold off on heavy lifting until 8 weeks after surgery.  Talk to your gynecologist about your external uterine fibroid.

## 2013-06-06 ENCOUNTER — Encounter: Payer: Self-pay | Admitting: Diagnostic Radiology

## 2014-06-29 ENCOUNTER — Encounter (HOSPITAL_COMMUNITY): Payer: Self-pay

## 2014-06-29 ENCOUNTER — Inpatient Hospital Stay (HOSPITAL_COMMUNITY)
Admission: AD | Admit: 2014-06-29 | Discharge: 2014-06-29 | Disposition: A | Discharge status: Home / Self Care | Payer: BC Managed Care – PPO | Source: Ambulatory Visit | Attending: Family Medicine | Admitting: Family Medicine

## 2014-06-29 ENCOUNTER — Inpatient Hospital Stay (HOSPITAL_COMMUNITY): Payer: BC Managed Care – PPO

## 2014-06-29 DIAGNOSIS — Z3A01 Less than 8 weeks gestation of pregnancy: Secondary | ICD-10-CM | POA: Diagnosis not present

## 2014-06-29 DIAGNOSIS — R58 Hemorrhage, not elsewhere classified: Secondary | ICD-10-CM

## 2014-06-29 DIAGNOSIS — O209 Hemorrhage in early pregnancy, unspecified: Secondary | ICD-10-CM | POA: Insufficient documentation

## 2014-06-29 LAB — URINALYSIS, ROUTINE W REFLEX MICROSCOPIC
BILIRUBIN URINE: NEGATIVE
Glucose, UA: NEGATIVE mg/dL
Ketones, ur: NEGATIVE mg/dL
Leukocytes, UA: NEGATIVE
NITRITE: NEGATIVE
PH: 6 (ref 5.0–8.0)
Protein, ur: NEGATIVE mg/dL
SPECIFIC GRAVITY, URINE: 1.02 (ref 1.005–1.030)
Urobilinogen, UA: 0.2 mg/dL (ref 0.0–1.0)

## 2014-06-29 LAB — ABO/RH: ABO/RH(D): B POS

## 2014-06-29 LAB — CBC
HEMATOCRIT: 39.3 % (ref 36.0–46.0)
Hemoglobin: 12.8 g/dL (ref 12.0–15.0)
MCH: 23.8 pg — AB (ref 26.0–34.0)
MCHC: 32.6 g/dL (ref 30.0–36.0)
MCV: 73.2 fL — ABNORMAL LOW (ref 78.0–100.0)
Platelets: 150 10*3/uL (ref 150–400)
RBC: 5.37 MIL/uL — ABNORMAL HIGH (ref 3.87–5.11)
RDW: 14.9 % (ref 11.5–15.5)
WBC: 9.4 10*3/uL (ref 4.0–10.5)

## 2014-06-29 LAB — POCT PREGNANCY, URINE: Preg Test, Ur: POSITIVE — AB

## 2014-06-29 LAB — WET PREP, GENITAL
Trich, Wet Prep: NONE SEEN
YEAST WET PREP: NONE SEEN

## 2014-06-29 LAB — URINE MICROSCOPIC-ADD ON

## 2014-06-29 LAB — RPR

## 2014-06-29 LAB — HCG, QUANTITATIVE, PREGNANCY: HCG, BETA CHAIN, QUANT, S: 1945 m[IU]/mL — AB (ref <5)

## 2014-06-29 LAB — HIV ANTIBODY (ROUTINE TESTING W REFLEX): HIV: NONREACTIVE

## 2014-06-29 NOTE — MAU Note (Signed)
Pt states here for early pregnancy bleeding that began around 0800 this am. Last intercourse 2-3 weeks ago. Did see one dark red blood clot in shower this am. No pain. Very little bleeding, only seen when wiping post voiding.

## 2014-06-29 NOTE — MAU Provider Note (Signed)
History     CSN: [id number]  Arrival date and time: 06/29/14 1328   None     Chief Complaint  Patient presents with  . Vaginal Bleeding   HPIpt is G1p0 at [redacted]w[redacted]d pregnant and presents with dark red blood noticed this morning when went to the bathroom. Pt denies pain, nausea, vomiting, diarrhea or UTI sx or other vaginal discharge  RN note: Joselyn Glassman, RN Registered Nurse Signed  MAU Note 06/29/2014 1:51 PM    Expand All Collapse All   Pt states here for early pregnancy bleeding that began around 0800 this am. Last intercourse 2-3 weeks ago. Did see one dark red blood clot in shower this am. No pain. Very little bleeding, only seen when wiping post voiding.        Past Medical History  Diagnosis Date  . Appendicitis, acute 05/17/2013  . Uterine mass 05/17/2013  . Heart murmur     Past Surgical History  Procedure Laterality Date  . Laparoscopic appendectomy N/A 05/16/2013    Procedure: APPENDECTOMY LAPAROSCOPIC uterine biopsy;  Surgeon: Leighton Ruff, MD;  Location: [facility];  Service: General;  Laterality: N/A;    Family History  Problem Relation Age of Onset  . Cancer Maternal Aunt     breast    History  Substance Use Topics  . Smoking status: Never Smoker   . Smokeless tobacco: Never Used  . Alcohol Use: 1.8 oz/week    3 Cans of beer per week    Allergies: No Known Allergies  Prescriptions prior to admission  Medication Sig Dispense Refill Last Dose  . acetaminophen (TYLENOL) 325 MG tablet Take 2 tablets (650 mg total) by mouth every 6 (six) hours as needed for pain.   Taking  . calcium carbonate (TUMS - DOSED IN MG ELEMENTAL CALCIUM) 500 MG chewable tablet Chew 1 tablet by mouth daily.   Taking  . diphenhydrAMINE (BENADRYL) 25 mg capsule Take 25 mg by mouth every 6 (six) hours as needed for itching.   Taking  . ibuprofen (ADVIL,MOTRIN) 200 MG tablet You can take 2-3 tablets every 6 hours as needed for pain. 30 tablet 0 Taking  . norethindrone-ethinyl  estradiol (JUNEL FE,GILDESS FE,LOESTRIN FE) 1-20 MG-MCG tablet Take 1 tablet by mouth daily.   Taking    ROS Physical Exam   Blood pressure 130/71, pulse 74, temperature 98.8 F (37.1 C), temperature source Oral, resp. rate 16, height 5\' 5"  (1.651 m), weight 187 lb (84.823 kg), last menstrual period 05/23/2014.  Physical Exam  Nursing note and vitals reviewed. Constitutional: She is oriented to person, place, and time. She appears well-developed and well-nourished. No distress.  HENT:  Head: Normocephalic.  Eyes: Pupils are equal, round, and reactive to light.  Neck: Normal range of motion. Neck supple.  Cardiovascular: Normal rate.   Respiratory: Effort normal.  GI: Soft.  Genitourinary:  Small amount of opaque pink discharge in vault; cervix clean, NT; uterus NSSC NT; adnexa without palpable enlargement or tenderness  Musculoskeletal: Normal range of motion.  Neurological: She is alert and oriented to person, place, and time.  Skin: Skin is warm and dry.  Psychiatric: She has a normal mood and affect.    MAU Course  Procedures Results for orders placed or performed during the hospital encounter of 06/29/14 (from the past 24 hour(s))  Urinalysis, Routine w reflex microscopic     Status: Abnormal   Collection Time: 06/29/14  1:28 PM  Result Value Ref Range   Color, Urine YELLOW  YELLOW   APPearance CLEAR CLEAR   Specific Gravity, Urine 1.020 1.005 - 1.030   pH 6.0 5.0 - 8.0   Glucose, UA NEGATIVE NEGATIVE mg/dL   Hgb urine dipstick LARGE (A) NEGATIVE   Bilirubin Urine NEGATIVE NEGATIVE   Ketones, ur NEGATIVE NEGATIVE mg/dL   Protein, ur NEGATIVE NEGATIVE mg/dL   Urobilinogen, UA 0.2 0.0 - 1.0 mg/dL   Nitrite NEGATIVE NEGATIVE   Leukocytes, UA NEGATIVE NEGATIVE  Urine microscopic-add on     Status: Abnormal   Collection Time: 06/29/14  1:28 PM  Result Value Ref Range   Squamous Epithelial / LPF FEW (A) RARE   WBC, UA 3-6 <3 WBC/hpf   RBC / HPF 3-6 <3 RBC/hpf    Bacteria, UA FEW (A) RARE  Pregnancy, urine POC     Status: Abnormal   Collection Time: 06/29/14  1:40 PM  Result Value Ref Range   Preg Test, Ur POSITIVE (A) NEGATIVE  hCG, quantitative, pregnancy     Status: Abnormal   Collection Time: 06/29/14  2:40 PM  Result Value Ref Range   hCG, Beta Chain, Quant, S 1945 (H) <5 mIU/mL  CBC     Status: Abnormal   Collection Time: 06/29/14  2:40 PM  Result Value Ref Range   WBC 9.4 4.0 - 10.5 K/uL   RBC 5.37 (H) 3.87 - 5.11 MIL/uL   Hemoglobin 12.8 12.0 - 15.0 g/dL   HCT 39.3 36.0 - 46.0 %   MCV 73.2 (L) 78.0 - 100.0 fL   MCH 23.8 (L) 26.0 - 34.0 pg   MCHC 32.6 30.0 - 36.0 g/dL   RDW 14.9 11.5 - 15.5 %   Platelets 150 150 - 400 K/uL  ABO/Rh     Status: None (Preliminary result)   Collection Time: 06/29/14  2:40 PM  Result Value Ref Range   ABO/RH(D) B POS   US Ob Comp Less 14 Wks  06/29/2014   CLINICAL DATA:  Patient with vaginal bleeding since 8 a.m.  EXAM: OBSTETRIC <14 WK [place] AND TRANSVAGINAL OB US  TECHNIQUE: Both transabdominal and transvaginal ultrasound examinations were performed for complete evaluation of the gestation as well as the maternal uterus, adnexal regions, and pelvic cul-de-sac. Transvaginal technique was performed to assess early pregnancy.  COMPARISON:  CT 05/16/2013  FINDINGS: Intrauterine gestational sac: Visualized/normal in shape.  Yolk sac:  Not definitely present.  Embryo:  Not present  Cardiac Activity: Not present  MSD:  4.9  mm   5 w   0  d  Maternal uterus/adnexae: The left and right ovaries are unremarkable. There is trace free fluid in the pelvis. Note is made of a pedunculated fibroid off the uterine fundus measuring 2.2 x 2.5 x 2.9 cm. Additionally there is a fibroid located within the posterior uterine body in an intramural location measuring 1.0 x 0.9 x 0.9 cm.  IMPRESSION: Probable early intrauterine gestational sac, but no definite yolk sac, fetal pole, or cardiac activity yet visualized. Recommend follow-up  quantitative B-HCG levels and follow-up US in 14 days to confirm and assess viability. This recommendation follows SRU consensus guidelines: Diagnostic Criteria for Nonviable Pregnancy Early in the First Trimester. [place] Med 2013; 193:7902-40.   Electronically Signed   By: Lovey Newcomer M.D.   On: 06/29/2014 16:07   US Ob Transvaginal  06/29/2014   CLINICAL DATA:  Patient with vaginal bleeding since 8 a.m.  EXAM: OBSTETRIC <14 WK [place] AND TRANSVAGINAL OB US  TECHNIQUE: Both transabdominal and transvaginal  ultrasound examinations were performed for complete evaluation of the gestation as well as the maternal uterus, adnexal regions, and pelvic cul-de-sac. Transvaginal technique was performed to assess early pregnancy.  COMPARISON:  CT 05/16/2013  FINDINGS: Intrauterine gestational sac: Visualized/normal in shape.  Yolk sac:  Not definitely present.  Embryo:  Not present  Cardiac Activity: Not present  MSD:  4.9  mm   5 w   0  d  Maternal uterus/adnexae: The left and right ovaries are unremarkable. There is trace free fluid in the pelvis. Note is made of a pedunculated fibroid off the uterine fundus measuring 2.2 x 2.5 x 2.9 cm. Additionally there is a fibroid located within the posterior uterine body in an intramural location measuring 1.0 x 0.9 x 0.9 cm.  IMPRESSION: Probable early intrauterine gestational sac, but no definite yolk sac, fetal pole, or cardiac activity yet visualized. Recommend follow-up quantitative B-HCG levels and follow-up US in 14 days to confirm and assess viability. This recommendation follows SRU consensus guidelines: Diagnostic Criteria for Nonviable Pregnancy Early in the First Trimester. [employer] Med 2013; 025:4270-62.   Electronically Signed   By: Lovey Newcomer M.D.   On: 06/29/2014 16:07   Results for orders placed or performed during the hospital encounter of 06/29/14 (from the past 24 hour(s))  Urinalysis, Routine w reflex microscopic     Status: Abnormal   Collection Time:  06/29/14  1:28 PM  Result Value Ref Range   Color, Urine YELLOW YELLOW   APPearance CLEAR CLEAR   Specific Gravity, Urine 1.020 1.005 - 1.030   pH 6.0 5.0 - 8.0   Glucose, UA NEGATIVE NEGATIVE mg/dL   Hgb urine dipstick LARGE (A) NEGATIVE   Bilirubin Urine NEGATIVE NEGATIVE   Ketones, ur NEGATIVE NEGATIVE mg/dL   Protein, ur NEGATIVE NEGATIVE mg/dL   Urobilinogen, UA 0.2 0.0 - 1.0 mg/dL   Nitrite NEGATIVE NEGATIVE   Leukocytes, UA NEGATIVE NEGATIVE  Urine microscopic-add on     Status: Abnormal   Collection Time: 06/29/14  1:28 PM  Result Value Ref Range   Squamous Epithelial / LPF FEW (A) RARE   WBC, UA 3-6 <3 WBC/hpf   RBC / HPF 3-6 <3 RBC/hpf   Bacteria, UA FEW (A) RARE  Pregnancy, urine POC     Status: Abnormal   Collection Time: 06/29/14  1:40 PM  Result Value Ref Range   Preg Test, Ur POSITIVE (A) NEGATIVE  hCG, quantitative, pregnancy     Status: Abnormal   Collection Time: 06/29/14  2:40 PM  Result Value Ref Range   hCG, Beta Chain, Quant, S 1945 (H) <5 mIU/mL  CBC     Status: Abnormal   Collection Time: 06/29/14  2:40 PM  Result Value Ref Range   WBC 9.4 4.0 - 10.5 K/uL   RBC 5.37 (H) 3.87 - 5.11 MIL/uL   Hemoglobin 12.8 12.0 - 15.0 g/dL   HCT 39.3 36.0 - 46.0 %   MCV 73.2 (L) 78.0 - 100.0 fL   MCH 23.8 (L) 26.0 - 34.0 pg   MCHC 32.6 30.0 - 36.0 g/dL   RDW 14.9 11.5 - 15.5 %   Platelets 150 150 - 400 K/uL  ABO/Rh     Status: None (Preliminary result)   Collection Time: 06/29/14  2:40 PM  Result Value Ref Range   ABO/RH(D) B POS   Wet prep, genital     Status: Abnormal   Collection Time: 06/29/14  5:24 PM  Result Value Ref Range  Yeast Wet Prep HPF POC NONE SEEN NONE SEEN   Trich, Wet Prep NONE SEEN NONE SEEN   Clue Cells Wet Prep HPF POC FEW (A) NONE SEEN   WBC, Wet Prep HPF POC FEW (A) NONE SEEN   Ectopic precautions given Assessment and Plan  Bleeding in early pregnancy- f/u in 48 hours for repeat HCG  Leib Elahi 06/29/2014, 2:10 PM

## 2014-07-01 ENCOUNTER — Inpatient Hospital Stay (HOSPITAL_COMMUNITY)
Admission: AD | Admit: 2014-07-01 | Discharge: 2014-07-01 | Disposition: A | Discharge status: Home / Self Care | Payer: BC Managed Care – PPO | Source: Ambulatory Visit | Attending: Obstetrics & Gynecology | Admitting: Obstetrics & Gynecology

## 2014-07-01 DIAGNOSIS — O200 Threatened abortion: Secondary | ICD-10-CM | POA: Insufficient documentation

## 2014-07-01 DIAGNOSIS — R109 Unspecified abdominal pain: Secondary | ICD-10-CM | POA: Insufficient documentation

## 2014-07-01 DIAGNOSIS — O001 Tubal pregnancy: Secondary | ICD-10-CM | POA: Insufficient documentation

## 2014-07-01 DIAGNOSIS — O4691 Antepartum hemorrhage, unspecified, first trimester: Secondary | ICD-10-CM

## 2014-07-01 DIAGNOSIS — O209 Hemorrhage in early pregnancy, unspecified: Secondary | ICD-10-CM

## 2014-07-01 DIAGNOSIS — Z3A01 Less than 8 weeks gestation of pregnancy: Secondary | ICD-10-CM | POA: Insufficient documentation

## 2014-07-01 DIAGNOSIS — O3680X0 Pregnancy with inconclusive fetal viability, not applicable or unspecified: Secondary | ICD-10-CM

## 2014-07-01 LAB — GC/CHLAMYDIA PROBE AMP
CT PROBE, AMP APTIMA: NEGATIVE
GC PROBE AMP APTIMA: NEGATIVE

## 2014-07-01 LAB — HCG, QUANTITATIVE, PREGNANCY: hCG, Beta Chain, Quant, S: 2961 m[IU]/mL — ABNORMAL HIGH (ref <5)

## 2014-07-01 NOTE — MAU Note (Signed)
Pt here for follow up.  Continues to bleed, bright red- mainly sees just when wipes. No pain.

## 2014-07-01 NOTE — MAU Provider Note (Signed)
History     CSN: [id number]  Arrival date and time: 07/01/14 4944   First Provider Initiated Contact with Patient 07/01/14 [phone]      Chief Complaint  Patient presents with  . Follow-up   HPI  Janeil Schexnayder is a 23 y.o. G1P0 at [redacted]w[redacted]d who presents today for FU HCG. She states that she is still having some bleeding and cramping, but it is the same as when she was here two days ago. She states that the bleeding is mostly brown and present when she wipes. Sometimes it will be pink spotting. She has an appointment to start [facility]   Past Medical History  Diagnosis Date  . Appendicitis, acute 05/17/2013  . Uterine mass 05/17/2013  . Heart murmur     Past Surgical History  Procedure Laterality Date  . Laparoscopic appendectomy N/A 05/16/2013    Procedure: APPENDECTOMY LAPAROSCOPIC uterine biopsy;  Surgeon: Leighton Ruff, MD;  Location: [facility];  Service: General;  Laterality: N/A;    Family History  Problem Relation Age of Onset  . Cancer Maternal Aunt     breast    History  Substance Use Topics  . Smoking status: Never Smoker   . Smokeless tobacco: Never Used  . Alcohol Use: 1.8 oz/week    3 Cans of beer per week    Allergies: No Known Allergies  Prescriptions prior to admission  Medication Sig Dispense Refill Last Dose  . acetaminophen (TYLENOL) 325 MG tablet Take 2 tablets (650 mg total) by mouth every 6 (six) hours as needed for pain. (Patient not taking: Reported on 06/29/2014)   Taking  . calcium carbonate (TUMS - DOSED IN MG ELEMENTAL CALCIUM) 500 MG chewable tablet Chew 1 tablet by mouth daily.   Taking  . diphenhydrAMINE (BENADRYL) 25 mg capsule Take 25 mg by mouth every 6 (six) hours as needed for itching.   PRN at PRN   Results for orders placed or performed during the hospital encounter of 07/01/14 (from the past 48 hour(s))  hCG, quantitative, pregnancy     Status: Abnormal   Collection Time: 07/01/14  7:29 AM  Result Value Ref  Range   hCG, Beta Chain, Quant, S 2961 (H) <5 mIU/mL    Comment:          GEST. AGE      CONC.  (mIU/mL)   <=1 WEEK        5 - 50     2 WEEKS       50 - 500     3 WEEKS       100 - 10,000     4 WEEKS     1,000 - 30,000     5 WEEKS     3,500 - 115,000   6-8 WEEKS     12,000 - 270,000    12 WEEKS     15,000 - 220,000        FEMALE AND NON-PREGNANT FEMALE:     LESS THAN 5 mIU/mL     ROS Physical Exam   Blood pressure 138/68, pulse 65, temperature 98.6 F (37 C), temperature source Oral, resp. rate 18, last menstrual period 05/23/2014.  Physical Exam  Nursing note and vitals reviewed. Constitutional: She is oriented to person, place, and time. She appears well-developed and well-nourished. No distress.  Cardiovascular: Normal rate.   Respiratory: Effort normal.  GI: Soft. There is no tenderness. There is no rebound.  Neurological: She is alert  and oriented to person, place, and time.  Skin: Skin is warm and dry.  Psychiatric: She has a normal mood and affect.    MAU Course  Procedures  0745: Patient has to leave for work. Reviewed ectopic concerns with the patient. She has left a phone number where she can be reached within the next hour. Will call with results when available.  0800: Report given to J. Will Schier NP. She will call the patient with the result. 0825: attempted to call patient with the phone number that is listed; no answer. I will call patient back in 15 mins.  0850: patient called back, hcg results discussed. Quant did not rise 60%; although it had not been completely 48 hours since previous beta hcg level. I explained this fully to the patient and asked that she come back in 48 hours ([place] 0700) for repeat beta hcg level. At that time we would discuss further imaging. Patient agreed with plan of care.   Mathis Bud 07/01/2014 7:49 AM   Assessment and Plan   A: Vaginal bleeding in early pregnancy Abdominal pain in pregnancy Pregnancy of unknown  location  Threatened miscarriage vs. Ectopic pregnancy  P: Discharge home in stable condition Return to MAU with any increased pain or increased bleeding Ectopic precautions Bleeding precautions Pelvic rest  Return to MAU in 48 hours for repeat beta hcg level (order placed)  Darrelyn Hillock Payeton Germani, NP 07/01/2014 8:56 AM

## 2014-07-03 ENCOUNTER — Inpatient Hospital Stay (HOSPITAL_COMMUNITY)
Admission: AD | Admit: 2014-07-03 | Discharge: 2014-07-03 | Disposition: A | Discharge status: Home / Self Care | Payer: BC Managed Care – PPO | Source: Ambulatory Visit | Attending: Obstetrics & Gynecology | Admitting: Obstetrics & Gynecology

## 2014-07-03 ENCOUNTER — Inpatient Hospital Stay (HOSPITAL_COMMUNITY): Payer: BC Managed Care – PPO

## 2014-07-03 DIAGNOSIS — R109 Unspecified abdominal pain: Secondary | ICD-10-CM | POA: Diagnosis present

## 2014-07-03 DIAGNOSIS — O200 Threatened abortion: Secondary | ICD-10-CM

## 2014-07-03 DIAGNOSIS — Z3A01 Less than 8 weeks gestation of pregnancy: Secondary | ICD-10-CM | POA: Insufficient documentation

## 2014-07-03 DIAGNOSIS — O209 Hemorrhage in early pregnancy, unspecified: Secondary | ICD-10-CM

## 2014-07-03 LAB — HCG, QUANTITATIVE, PREGNANCY: hCG, Beta Chain, Quant, S: 4097 m[IU]/mL — ABNORMAL HIGH (ref <5)

## 2014-07-03 NOTE — MAU Note (Signed)
Pt here for repeat BHCG.  Pt denies pain, states she woke up @ 0430 bleeding, passed a clot, bleeding has been lighter since then.  Has changed one pad this a.m.

## 2014-07-03 NOTE — MAU Provider Note (Signed)
History     CSN: [id number]  Arrival date and time: 07/03/14 0709   None     Chief Complaint  Patient presents with  . Follow-up   HPI   Ms. Casey Schroeder is a 23 y.o. female G1P0 at [redacted]w[redacted]d who presents for a follow up beta hcg level. She was seen 2 days ago with abdominal pain and bleeding. On [place] a probable IUP was seen, no yolk sac. The bleeding has gotten heavier and seems worse than it was 2 days ago; it is still described as light bleeding.    Expand All Collapse All   Pt here for repeat BHCG. Pt denies pain, states she woke up @ 0430 bleeding, passed a clot, bleeding has been lighter since then. Has changed one pad this a.m.       OB History    Gravida Para Term Preterm AB TAB SAB Ectopic Multiple Living   1               Past Medical History  Diagnosis Date  . Appendicitis, acute 05/17/2013  . Uterine mass 05/17/2013  . Heart murmur     Past Surgical History  Procedure Laterality Date  . Laparoscopic appendectomy N/A 05/16/2013    Procedure: APPENDECTOMY LAPAROSCOPIC uterine biopsy;  Surgeon: Leighton Ruff, MD;  Location: [facility];  Service: General;  Laterality: N/A;    Family History  Problem Relation Age of Onset  . Cancer Maternal Aunt     breast    History  Substance Use Topics  . Smoking status: Never Smoker   . Smokeless tobacco: Never Used  . Alcohol Use: 1.8 oz/week    3 Cans of beer per week    Allergies: No Known Allergies  Prescriptions prior to admission  Medication Sig Dispense Refill Last Dose  . acetaminophen (TYLENOL) 325 MG tablet Take 2 tablets (650 mg total) by mouth every 6 (six) hours as needed for pain. (Patient not taking: Reported on 06/29/2014)   Taking  . calcium carbonate (TUMS - DOSED IN MG ELEMENTAL CALCIUM) 500 MG chewable tablet Chew 1 tablet by mouth daily.   Taking  . diphenhydrAMINE (BENADRYL) 25 mg capsule Take 25 mg by mouth every 6 (six) hours as needed for itching.   PRN at PRN    Results for orders placed or  performed during the hospital encounter of 07/03/14 (from the past 48 hour(s))  hCG, quantitative, pregnancy     Status: Abnormal   Collection Time: 07/03/14  7:19 AM  Result Value Ref Range   hCG, Beta Chain, Quant, S 4097 (H) <5 mIU/mL    Comment:          GEST. AGE      CONC.  (mIU/mL)   <=1 WEEK        5 - 50     2 WEEKS       50 - 500     3 WEEKS       100 - 10,000     4 WEEKS     1,000 - 30,000     5 WEEKS     3,500 - 115,000   6-8 WEEKS     12,000 - 270,000    12 WEEKS     15,000 - 220,000        FEMALE AND NON-PREGNANT FEMALE:     LESS THAN 5 mIU/mL     US Ob Transvaginal  07/03/2014   CLINICAL DATA:  Followup beta HCG.  New  onset vaginal bleeding.  EXAM: OBSTETRIC <14 WK [place] AND TRANSVAGINAL OB US  TECHNIQUE: Both transabdominal and transvaginal ultrasound examinations were performed for complete evaluation of the gestation as well as the maternal uterus, adnexal regions, and pelvic cul-de-sac. Transvaginal technique was performed to assess early pregnancy.  COMPARISON:  06/29/2014.  FINDINGS: Intrauterine gestational sac: Single  Yolk sac:  Yes  Embryo:  No  Cardiac Activity: No  Heart Rate:  Not applicable bpm  MSD:  7.9  mm   5 w   3  d               [place] EDC: 02/27/2015  Maternal uterus/adnexae:  Subchorionic hemorrhage: Moderate  Right ovary: Normal  Left ovary: Normal  Other :None  Free fluid:  Trace  IMPRESSION: 1. Probable early intrauterine gestational sac with yolk sac, but no fetal pole, or cardiac activity yet visualized. Recommend follow-up quantitative B-HCG levels and follow-up US in 14 days to confirm and assess viability. This recommendation follows SRU consensus guidelines: Diagnostic Criteria for Nonviable Pregnancy Early in the First Trimester. [place] Med 2013; 675:9163-84. 2. Moderate subchorionic hemorrhage which may explain the patient's onset vaginal bleeding.   Electronically Signed   By: Kerby Moors M.D.   On: 07/03/2014 09:02     Review of Systems   Constitutional: Negative for fever and chills.  Gastrointestinal: Negative for abdominal pain.  Genitourinary:       + vaginal bleeding    Physical Exam   Blood pressure 125/72, pulse 66, temperature 98.8 F (37.1 C), temperature source Oral, resp. rate 16, last menstrual period 05/23/2014.  Physical Exam  Constitutional: She is oriented to person, place, and time. She appears well-developed and well-nourished. No distress.  Respiratory: Effort normal.  GI: Soft.  Musculoskeletal: Normal range of motion.  Neurological: She is alert and oriented to person, place, and time.  Skin: Skin is warm. She is not diaphoretic.    MAU Course  Procedures  None  MDM 12/7: 2961 12/9: 4097 HCG Repeat US due do to heavier vaginal bleeding.  B positive blood type   Assessment and Plan  A: 1. Threatened miscarriage   2. Vaginal bleeding before [redacted] weeks gestation   3.      Subchorionic hemorrhage 4.      IUP with yolk sac     P: Discharge home in stable condition Start prenatal care ASAP Return to MAU if symptoms worsen  Bleeding precautions Pelvic rest    [place], NP 07/03/2014 6:04 PM

## 2014-07-03 NOTE — Discharge Instructions (Signed)

## 2015-05-04 ENCOUNTER — Encounter (HOSPITAL_COMMUNITY): Payer: Self-pay | Admitting: *Deleted

## 2017-10-07 ENCOUNTER — Ambulatory Visit (HOSPITAL_COMMUNITY)
Admission: EM | Admit: 2017-10-07 | Discharge: 2017-10-07 | Disposition: A | Discharge status: Home / Self Care | Payer: BC Managed Care – PPO | Attending: Emergency Medicine | Admitting: Emergency Medicine

## 2017-10-07 ENCOUNTER — Encounter (HOSPITAL_COMMUNITY): Payer: Self-pay | Admitting: Emergency Medicine

## 2017-10-07 DIAGNOSIS — R05 Cough: Secondary | ICD-10-CM | POA: Diagnosis not present

## 2017-10-07 DIAGNOSIS — J45909 Unspecified asthma, uncomplicated: Secondary | ICD-10-CM

## 2017-10-07 DIAGNOSIS — R059 Cough, unspecified: Secondary | ICD-10-CM

## 2017-10-07 MED ORDER — IPRATROPIUM-ALBUTEROL 0.5-2.5 (3) MG/3ML IN SOLN
3.0000 mL | Freq: Once | RESPIRATORY_TRACT | Status: AC
  Administered 2017-10-07: 3 mL via RESPIRATORY_TRACT

## 2017-10-07 MED ORDER — IPRATROPIUM-ALBUTEROL 0.5-2.5 (3) MG/3ML IN SOLN
RESPIRATORY_TRACT | Status: AC
  Filled 2017-10-07: qty 3

## 2017-10-07 MED ORDER — HYDROCOD POLST-CPM POLST ER 10-8 MG/5ML PO SUER: 5.0000 mL | Freq: Two times a day (BID) | ORAL | 0 refills | Status: DC | PRN

## 2017-10-07 MED ORDER — ALBUTEROL SULFATE HFA 108 (90 BASE) MCG/ACT IN AERS: 1.0000 | INHALATION_SPRAY | Freq: Four times a day (QID) | RESPIRATORY_TRACT | 0 refills | Status: DC | PRN

## 2017-10-07 MED ORDER — AEROCHAMBER PLUS MISC: 2 refills | Status: DC

## 2017-10-07 MED ORDER — BENZONATATE 200 MG PO CAPS: 200.0000 mg | ORAL_CAPSULE | Freq: Three times a day (TID) | ORAL | 0 refills | Status: DC | PRN

## 2017-10-07 MED ORDER — IBUPROFEN 600 MG PO TABS: 600.0000 mg | ORAL_TABLET | Freq: Four times a day (QID) | ORAL | 0 refills | Status: DC | PRN

## 2017-10-07 MED ORDER — FLUTICASONE PROPIONATE 50 MCG/ACT NA SUSP: 2.0000 | Freq: Every day | NASAL | 0 refills | Status: DC

## 2017-10-07 NOTE — ED Provider Notes (Signed)
HPI  SUBJECTIVE:  Casey Schroeder is a 27 y.o. female who presents with several days of allergy type symptoms with nasal congestion, itchy, watery eyes, sneezing, postnasal drip.  She reports a nonproductive cough starting yesterday.  Comes in today for diffuse "chest tightness".  She reports occasional sharp chest pain with deep inspiration and sharp pain between her shoulder blades.  She states that she is waking up at night coughing.  She denies wheezing.  No shortness of breath, dyspnea on exertion.  No calf pain, swelling, hemoptysis, surgery in the past 4 weeks, prolonged immobilization, exogenous estrogen.  She tried ibuprofen 600 mg her last dose was yesterday, Mucinex DM, sinus congestion medication.  Symptoms better with an allergy medication.  No aggravating factors.  Past medical history negative for cancer, DVT, PE, CHF, asthma, eczema, COPD, smoking, diabetes, hypertension.  She has a past medical history of intermittent GERD and a cardiac murmur.  LMP: Now.  Denies possibility being pregnant.  PMD: None.    Past Medical History:  Diagnosis Date  . Appendicitis, acute 05/17/2013  . Heart murmur   . Uterine mass 05/17/2013    Past Surgical History:  Procedure Laterality Date  . APPENDECTOMY    . LAPAROSCOPIC APPENDECTOMY N/A 05/16/2013   Procedure: APPENDECTOMY LAPAROSCOPIC uterine biopsy;  Surgeon: Leighton Ruff, MD;  Location: [facility];  Service: General;  Laterality: N/A;    Family History  Problem Relation Age of Onset  . Cancer Maternal Aunt        breast    Social History   Tobacco Use  . Smoking status: Never Smoker  . Smokeless tobacco: Never Used  Substance Use Topics  . Alcohol use: Yes    Alcohol/week: 1.8 oz    Types: 3 Cans of beer per week  . Drug use: No    No current facility-administered medications for this encounter.   Current Outpatient Medications:  .  albuterol (PROVENTIL HFA;VENTOLIN HFA) 108 (90 Base) MCG/ACT inhaler, Inhale 1-2 puffs into  the lungs every 6 (six) hours as needed for wheezing or shortness of breath., Disp: 1 Inhaler, Rfl: 0 .  benzonatate (TESSALON) 200 MG capsule, Take 1 capsule (200 mg total) by mouth 3 (three) times daily as needed for cough., Disp: 30 capsule, Rfl: 0 .  calcium carbonate (TUMS - DOSED IN MG ELEMENTAL CALCIUM) 500 MG chewable tablet, Chew 1 tablet by mouth daily., Disp: , Rfl:  .  chlorpheniramine-HYDROcodone (TUSSIONEX PENNKINETIC ER) 10-8 MG/5ML SUER, Take 5 mLs by mouth every 12 (twelve) hours as needed for cough., Disp: 120 mL, Rfl: 0 .  diphenhydrAMINE (BENADRYL) 25 mg capsule, Take 25 mg by mouth every 6 (six) hours as needed for itching., Disp: , Rfl:  .  fluticasone (FLONASE) 50 MCG/ACT nasal spray, Place 2 sprays into both nostrils daily., Disp: 16 g, Rfl: 0 .  ibuprofen (ADVIL,MOTRIN) 600 MG tablet, Take 1 tablet (600 mg total) by mouth every 6 (six) hours as needed., Disp: 30 tablet, Rfl: 0 .  Spacer/Aero-Holding Chambers (AEROCHAMBER PLUS) inhaler, Use as instructed, Disp: 1 each, Rfl: 2  No Known Allergies   ROS  As noted in HPI.   Physical Exam  BP 138/89   Pulse 100   Temp 98.8 F (37.1 C) (Temporal)   Resp 16   Wt 197 lb (89.4 kg)   LMP 10/02/2017   SpO2 100%   BMI 32.78 kg/m   Constitutional: Well developed, well nourished, no acute distress Eyes:  EOMI, conjunctiva normal bilaterally HENT: Normocephalic,  atraumatic,mucus membranes moist. positive clear extensive rhinorrhea.  Normal turbinates.  No sinus tenderness.  Positive cobblestoning.  No obvious postnasal drip. Respiratory: poor Respiratory effort, deep breaths make her cough.  Diffuse chest wall tenderness.  Fair air movement.  No wheezing, rales, rhonchi. Cardiovascular: Normal rate GI: nondistended skin: No rash, skin intact Musculoskeletal: Calves symmetric, nontender, no edema Neurologic: Alert & oriented x 3, no focal neuro deficits Psychiatric: Speech and behavior appropriate   ED  Course   Medications  ipratropium-albuterol (DUONEB) 0.5-2.5 (3) MG/3ML nebulizer solution 3 mL (3 mLs Nebulization Given 10/07/17 1722)    No orders of the defined types were placed in this encounter.   No results found for this or any previous visit (from the past 24 hour(s)). No results found.  ED Clinical Impression  Cough  Reactive airway disease without complication, unspecified asthma severity, unspecified whether persistent   ED Assessment/Plan  [place] Narcotic database reviewed for this patient, and feel that the risk/benefit ratio today is favorable for proceeding with a prescription for controlled substance.  No Opiate prescriptions in the past 2 years.  Suspect that patient has some reactive airway disease from her allergies.  Her lungs are clear, but she has diffuse chest wall tenderness.  Seriously doubt PE.  She does not meet the [facility] criteria due to heart rate however modified well's score is 0 out of 9.  She has had in 100% on room air.  Will give DuoNeb and reevaluate.  Reevaluation, patient states that she feels better.  Chest tightness improved. Lungs still clear.  Plan to send home with ibuprofen 600 mg to take 1 g of Tylenol 3-4 times a day, and albuterol inhaler with a spacer.  2 puffs every 4-6 hours as needed for cough, Flonase, Tessalon for the cough during the day, Tussionex for the cough at night.  Will provide a primary care referral list for ongoing care.  Discussed medical decision making, plan for follow-up with patient.  Discussed signs and symptoms that should prompt return to the emergency department.  She agrees with plan.  Meds ordered this encounter  Medications  . ipratropium-albuterol (DUONEB) 0.5-2.5 (3) MG/3ML nebulizer solution 3 mL  . fluticasone (FLONASE) 50 MCG/ACT nasal spray    Sig: Place 2 sprays into both nostrils daily.    Dispense:  16 g    Refill:  0  . Spacer/Aero-Holding Chambers (AEROCHAMBER PLUS) inhaler    Sig: Use as instructed     Dispense:  1 each    Refill:  2  . albuterol (PROVENTIL HFA;VENTOLIN HFA) 108 (90 Base) MCG/ACT inhaler    Sig: Inhale 1-2 puffs into the lungs every 6 (six) hours as needed for wheezing or shortness of breath.    Dispense:  1 Inhaler    Refill:  0  . ibuprofen (ADVIL,MOTRIN) 600 MG tablet    Sig: Take 1 tablet (600 mg total) by mouth every 6 (six) hours as needed.    Dispense:  30 tablet    Refill:  0  . benzonatate (TESSALON) 200 MG capsule    Sig: Take 1 capsule (200 mg total) by mouth 3 (three) times daily as needed for cough.    Dispense:  30 capsule    Refill:  0  . chlorpheniramine-HYDROcodone (TUSSIONEX PENNKINETIC ER) 10-8 MG/5ML SUER    Sig: Take 5 mLs by mouth every 12 (twelve) hours as needed for cough.    Dispense:  120 mL    Refill:  0    *  This clinic note was created using [employer]. Therefore, there may be occasional mistakes despite careful proofreading.   ?   Melynda Ripple, MD 10/08/17 [phone]

## 2017-10-07 NOTE — Discharge Instructions (Signed)
ibuprofen 600 mg to take 1 g of Tylenol 3-4 times a day, and albuterol inhaler with a spacer.  2 puffs every 4-6 hours as needed for cough, Flonase, Tessalon for the cough during the day, Tussionex for the cough at night.  Below is a list of primary care practices who are taking new patients for you to follow-up with. [facility] [place], [place] [address] [phone]  [facility] [phone] [place] [address]  [facility]: [place]  [phone]  [place] [facility]: [place]   [phone]  [facility]: [address] [place]  [phone]  [facility] : [address] [phone]  [facility]: [address] [place] [phone]  [facility]: [place] [phone]  Dr. Blanchie Serve [place]  [phone]  Dr. Benito Mccreedy, [facility]. [place], [place] [address]  [phone]  Go to www.goodrx.com to look up your medications. This will give you a list of where you can find your prescriptions at the most affordable prices. Or ask the pharmacist what the cash price is, or if they have any other discount programs available to help make your medication more affordable. This can be less expensive than what you would pay with insurance.

## 2017-10-07 NOTE — ED Triage Notes (Signed)
PT reports allergy symptoms that started earlier this week. PT woke up at 1am with a coughing fit and pain between shoulder blades, lower back pain, and chest tightness.

## 2017-11-16 ENCOUNTER — Ambulatory Visit (HOSPITAL_COMMUNITY)
Admission: EM | Admit: 2017-11-16 | Discharge: 2017-11-16 | Disposition: A | Discharge status: Home / Self Care | Payer: BC Managed Care – PPO | Attending: Family Medicine | Admitting: Family Medicine

## 2017-11-16 ENCOUNTER — Encounter (HOSPITAL_COMMUNITY): Payer: Self-pay | Admitting: Emergency Medicine

## 2017-11-16 ENCOUNTER — Other Ambulatory Visit: Payer: Self-pay

## 2017-11-16 DIAGNOSIS — J039 Acute tonsillitis, unspecified: Secondary | ICD-10-CM | POA: Diagnosis not present

## 2017-11-16 MED ORDER — AMOXICILLIN-POT CLAVULANATE 875-125 MG PO TABS: 1.0000 | ORAL_TABLET | Freq: Two times a day (BID) | ORAL | 0 refills | Status: DC

## 2017-11-16 NOTE — ED Triage Notes (Signed)
Pt reports that her tonsils have been swollen and sore since Monday morning.  Today she states her neck has been very sore.

## 2017-11-16 NOTE — ED Provider Notes (Signed)
[facility]   [id number] 11/16/17 Arrival Time: 1410   SUBJECTIVE:  Casey Schroeder is a 27 y.o. female who presents to the urgent care with complaint of tonsils have been swollen and sore since Monday morning.  Today she states her neck has been very sore.  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  Past Medical History:  Diagnosis Date  . Appendicitis, acute 05/17/2013  . Heart murmur   . Uterine mass 05/17/2013   Family History  Problem Relation Age of Onset  . Cancer Maternal Aunt        breast   Social History   Socioeconomic History  . Marital status: Divorced    Spouse name: Not on file  . Number of children: Not on file  . Years of education: Not on file  . Highest education level: Not on file  Occupational History  . Not on file  Social Needs  . Financial resource strain: Not on file  . Food insecurity:    Worry: Not on file    Inability: Not on file  . Transportation needs:    Medical: Not on file    Non-medical: Not on file  Tobacco Use  . Smoking status: Never Smoker  . Smokeless tobacco: Never Used  Substance and Sexual Activity  . Alcohol use: Yes    Alcohol/week: 1.8 oz    Types: 3 Cans of beer per week  . Drug use: No  . Sexual activity: Yes  Lifestyle  . Physical activity:    Days per week: Not on file    Minutes per session: Not on file  . Stress: Not on file  Relationships  . Social connections:    Talks on phone: Not on file    Gets together: Not on file    Attends religious service: Not on file    Active member of club or organization: Not on file    Attends meetings of clubs or organizations: Not on file    Relationship status: Not on file  . Intimate partner violence:    Fear of current or ex partner: Not on file    Emotionally abused: Not on file    Physically abused: Not on file    Forced sexual activity: Not on file  Other Topics Concern  . Not on file  Social History Narrative  . Not on file   Current  Meds  Medication Sig  . albuterol (PROVENTIL HFA;VENTOLIN HFA) 108 (90 Base) MCG/ACT inhaler Inhale 1-2 puffs into the lungs every 6 (six) hours as needed for wheezing or shortness of breath.  . fluticasone (FLONASE) 50 MCG/ACT nasal spray Place 2 sprays into both nostrils daily.  Marland Kitchen ibuprofen (ADVIL,MOTRIN) 600 MG tablet Take 1 tablet (600 mg total) by mouth every 6 (six) hours as needed.  Marland Kitchen Spacer/Aero-Holding Chambers (AEROCHAMBER PLUS) inhaler Use as instructed  . [DISCONTINUED] benzonatate (TESSALON) 200 MG capsule Take 1 capsule (200 mg total) by mouth 3 (three) times daily as needed for cough.  . [DISCONTINUED] chlorpheniramine-HYDROcodone (TUSSIONEX PENNKINETIC ER) 10-8 MG/5ML SUER Take 5 mLs by mouth every 12 (twelve) hours as needed for cough.   No Known Allergies    ROS: As per HPI, remainder of ROS negative.   OBJECTIVE:   Vitals:   11/16/17 1426  BP: 129/87  Pulse: (!) 112  Temp: 99.9 F (37.7 C)  TempSrc: Oral  SpO2: 96%     General appearance: alert; no distress Eyes: PERRL; EOMI; conjunctiva normal HENT: normocephalic; atraumatic;  TMs normal, canal normal, external ears normal without trauma; nasal mucosa normal; oral mucosa swollen, coated tonsils Neck: supple; tender, mildly enlarged cervical nodes bilaterally Back: no CVA tenderness Extremities: no cyanosis or edema; symmetrical with no gross deformities Skin: warm and dry Neurologic: normal gait; grossly normal Psychological: alert and cooperative; normal mood and affect      Labs:  Results for orders placed or performed during the hospital encounter of 07/03/14  hCG, quantitative, pregnancy  Result Value Ref Range   hCG, Beta Chain, Quant, S 4,097 (H) <5 mIU/mL    Labs Reviewed - No data to display  No results found.     ASSESSMENT & PLAN:  1. Tonsillitis     Meds ordered this encounter  Medications  . amoxicillin-clavulanate (AUGMENTIN) 875-125 MG tablet    Sig: Take 1 tablet by  mouth every 12 (twelve) hours.    Dispense:  14 tablet    Refill:  0    Reviewed expectations re: course of current medical issues. Questions answered. Outlined signs and symptoms indicating need for more acute intervention. Patient verbalized understanding. After Visit Summary given.    Procedures:      Robyn Haber, MD 11/16/17 1439

## 2018-10-17 ENCOUNTER — Other Ambulatory Visit (HOSPITAL_COMMUNITY)
Admission: RE | Admit: 2018-10-17 | Discharge: 2018-10-17 | Disposition: A | Discharge status: Home / Self Care | Payer: BC Managed Care – PPO | Source: Ambulatory Visit | Attending: Family Medicine | Admitting: Family Medicine

## 2018-10-17 ENCOUNTER — Other Ambulatory Visit: Payer: Self-pay | Admitting: Family Medicine

## 2018-10-17 DIAGNOSIS — Z0000 Encounter for general adult medical examination without abnormal findings: Secondary | ICD-10-CM | POA: Insufficient documentation

## 2018-10-19 LAB — CYTOLOGY - PAP: Diagnosis: NEGATIVE

## 2019-08-20 ENCOUNTER — Other Ambulatory Visit: Payer: Self-pay

## 2019-08-20 DIAGNOSIS — N644 Mastodynia: Secondary | ICD-10-CM

## 2019-08-31 ENCOUNTER — Ambulatory Visit
Admission: RE | Admit: 2019-08-31 | Discharge: 2019-08-31 | Disposition: A | Discharge status: Home / Self Care | Payer: BC Managed Care – PPO | Source: Ambulatory Visit | Attending: Physician Assistant | Admitting: Physician Assistant

## 2019-08-31 ENCOUNTER — Other Ambulatory Visit: Payer: Self-pay

## 2019-08-31 DIAGNOSIS — N644 Mastodynia: Secondary | ICD-10-CM

## 2019-09-29 ENCOUNTER — Ambulatory Visit: Payer: BC Managed Care – PPO | Attending: Internal Medicine

## 2019-09-29 DIAGNOSIS — Z23 Encounter for immunization: Secondary | ICD-10-CM

## 2019-09-29 NOTE — Progress Notes (Signed)
   Covid-19 Vaccination Clinic  Name:  Casey Schroeder    MRN: [id number] DOB: 07-Mar-1991  09/29/2019  [name] was observed post Covid-19 immunization for 15 minutes without incident. She was provided with Vaccine Information Sheet and instruction to access the V-Safe system.   [name] was instructed to call 911 with any severe reactions post vaccine: Marland Kitchen Difficulty breathing  . Swelling of face and throat  . A fast heartbeat  . A bad rash all over body  . Dizziness and weakness   Immunizations Administered    Name Date Dose VIS Date Route   Moderna COVID-19 Vaccine 09/29/2019  1:56 PM 0.5 mL 06/26/2019 Intramuscular   Manufacturer: Moderna   Lot: [id number]   [place]:[id number]

## 2019-10-27 ENCOUNTER — Ambulatory Visit: Payer: BC Managed Care – PPO | Attending: Internal Medicine

## 2019-10-27 DIAGNOSIS — Z23 Encounter for immunization: Secondary | ICD-10-CM

## 2019-10-27 NOTE — Progress Notes (Signed)
   Covid-19 Vaccination Clinic  Name:  Casey Schroeder    MRN: [id number] DOB: January 03, 1991  10/27/2019  [name] was observed post Covid-19 immunization for 15 minutes without incident. She was provided with Vaccine Information Sheet and instruction to access the V-Safe system.   [name] was instructed to call 911 with any severe reactions post vaccine: Marland Kitchen Difficulty breathing  . Swelling of face and throat  . A fast heartbeat  . A bad rash all over body  . Dizziness and weakness   Immunizations Administered    Name Date Dose VIS Date Route   Moderna COVID-19 Vaccine 10/27/2019 10:54 AM 0.5 mL 06/26/2019 Intramuscular   Manufacturer: Levan Hurst   LotSS:[id number]   [place]:[id number]

## 2020-08-24 IMAGING — MG DIGITAL DIAGNOSTIC BILAT W/ TOMO W/ CAD
6 of 10 series · 6 of 30 positions shown · non-contrast
Comparison: None.

CLINICAL DATA: 28-year-old presenting with diffuse RIGHT breast
pain for approximately 3 months, with more focal pain and possible
fluctuating palpable lump at the 4 o'clock position and a possible
fluctuating palpable lump 8 o'clock position of the RIGHT breast.
This is the patient's initial baseline mammogram.

Family history of breast cancer in her maternal aunt at age 30.
EXAM:
DIGITAL DIAGNOSTIC BILATERAL MAMMOGRAM WITH CAD AND TOMO
ULTRASOUND RIGHT BREAST

[L MLO synth-2D]
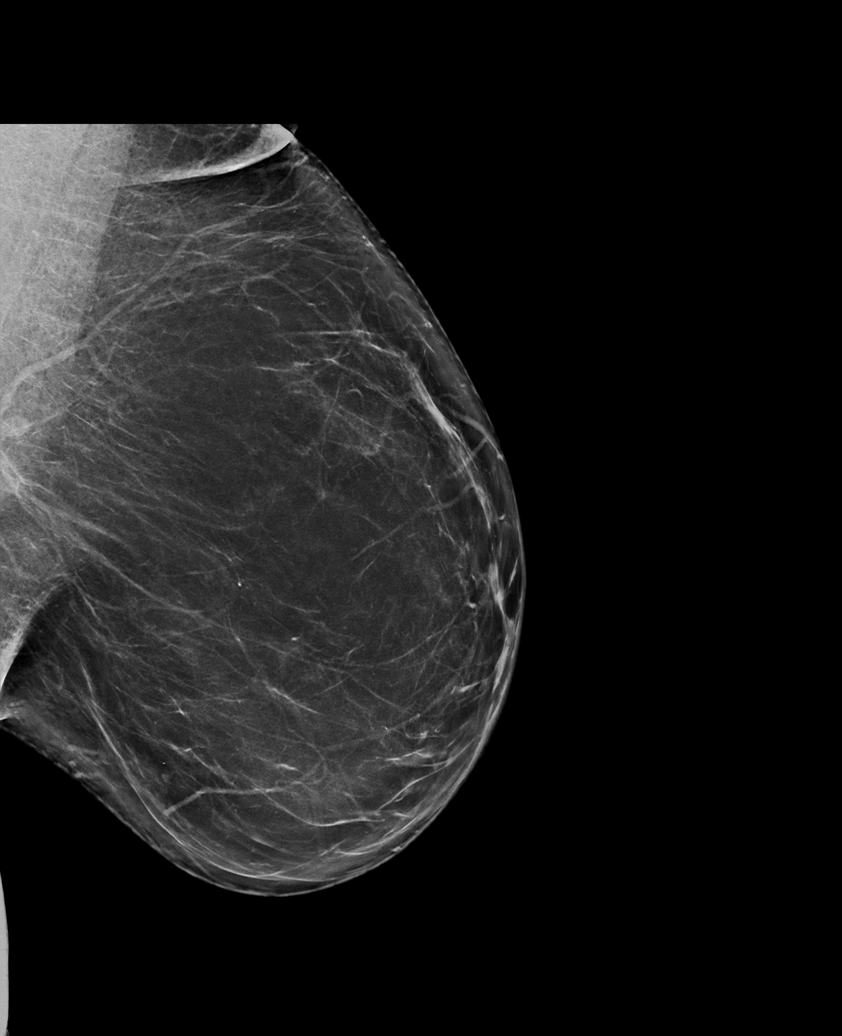

[R CC synth-2D]
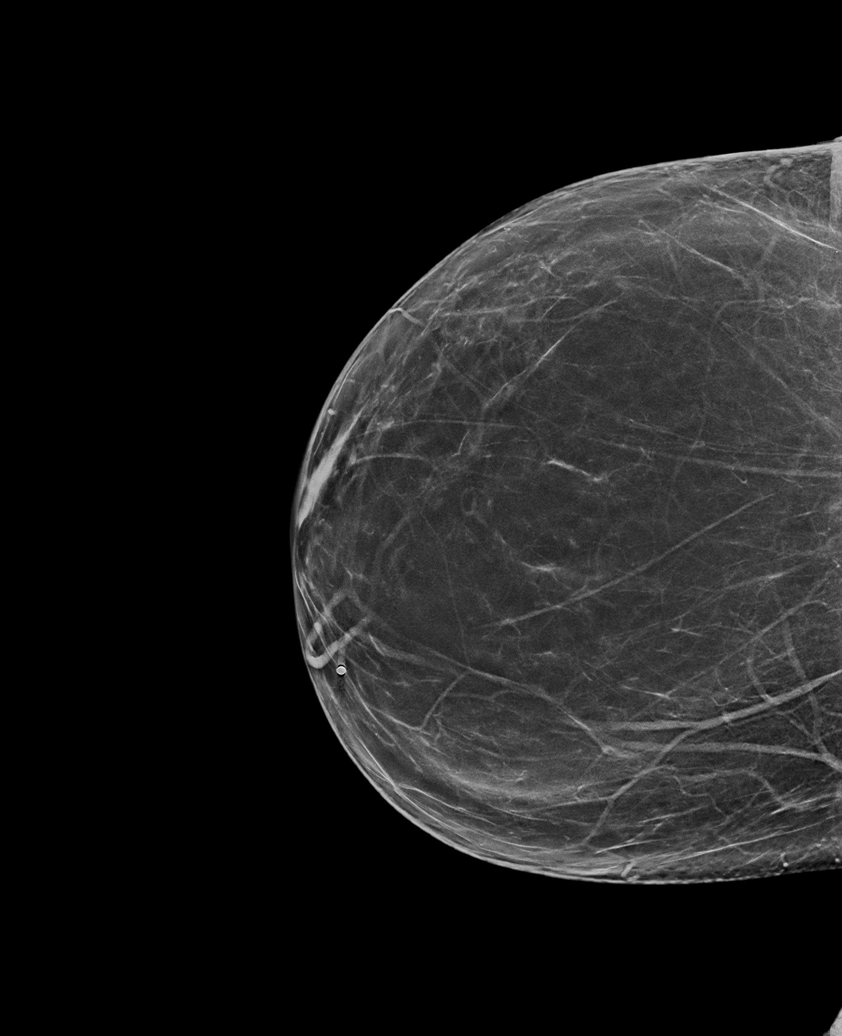

[R MLO synth-2D]
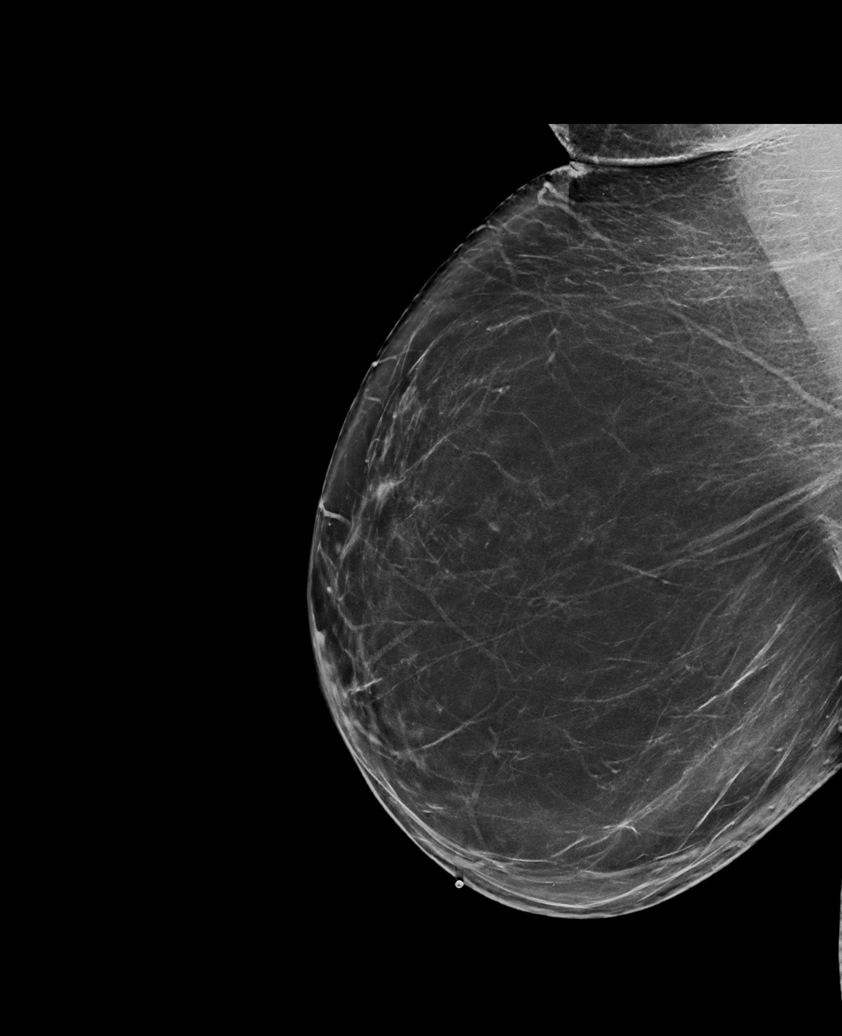

[R TAN synth-2D]
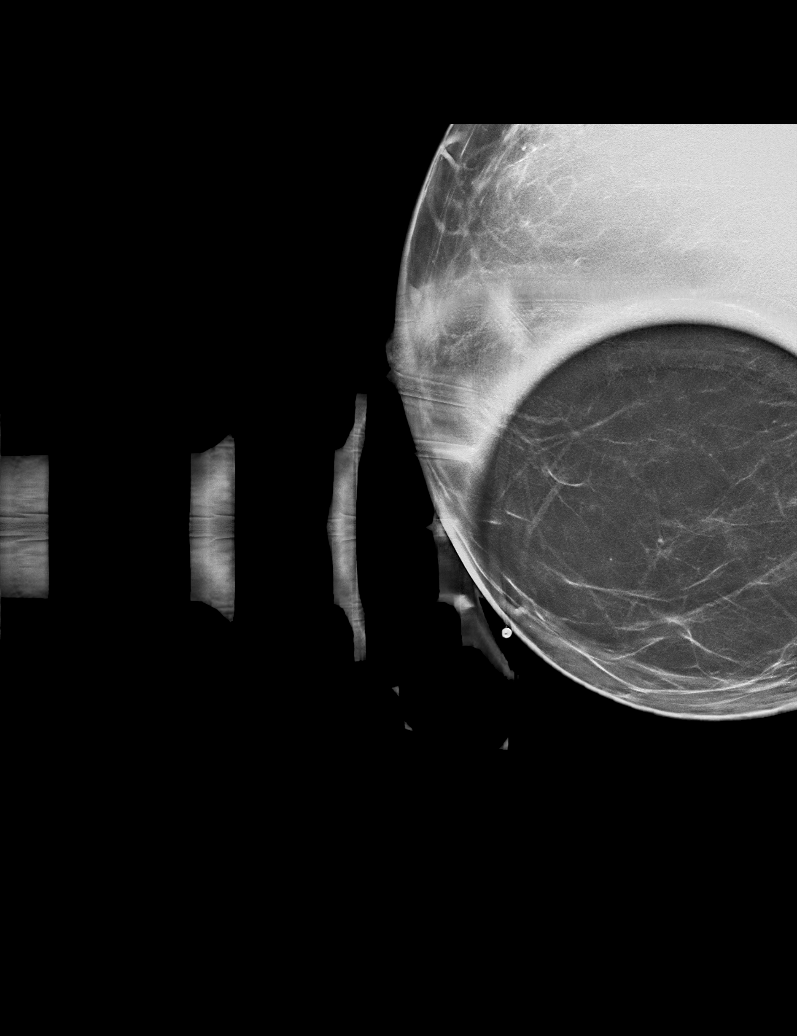

[L CC synth-2D]
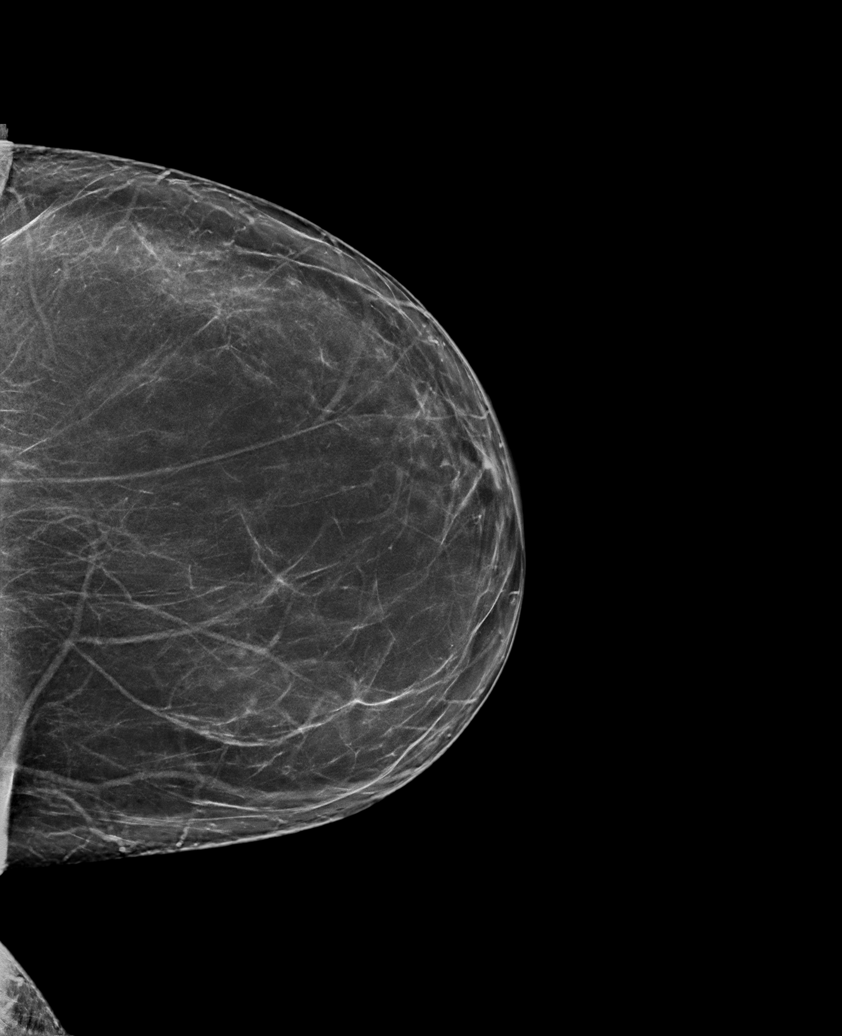

[R TAN tomo · tomo slice 33/65.0]
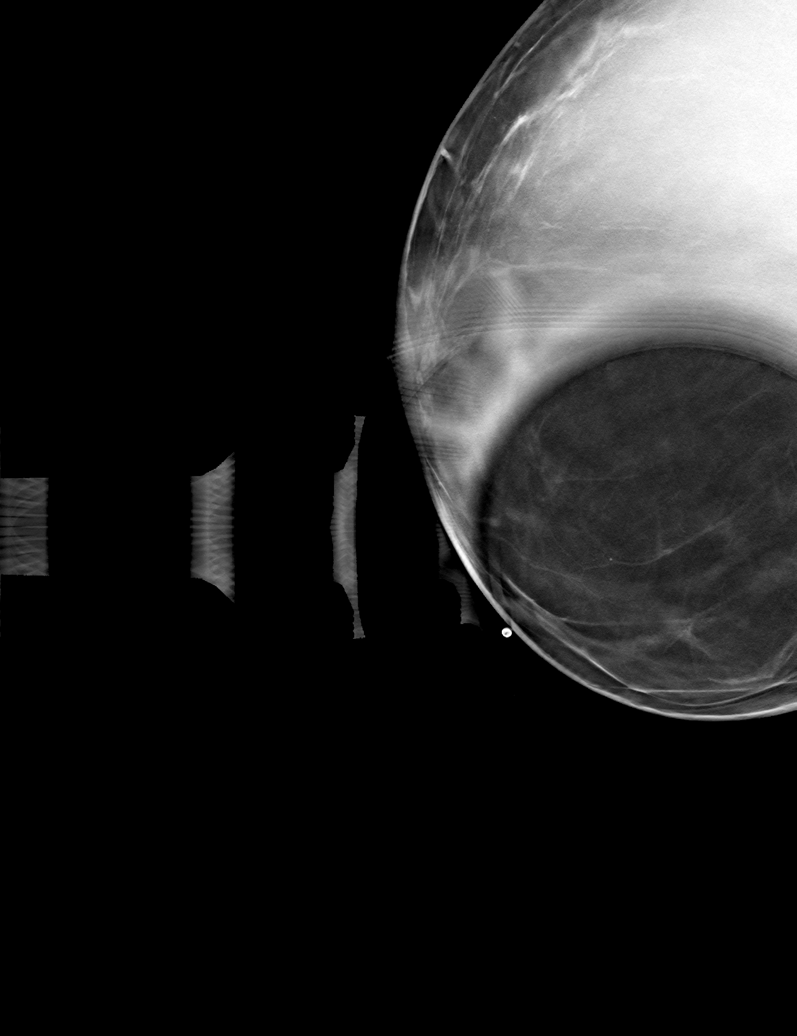

[6 of 30 positions shown; findings below may reference images not displayed]

ACR Breast Density Category b: There are scattered areas of
fibroglandular density.
FINDINGS: Tomosynthesis and synthesized full field CC and MLO views of both
breasts were obtained. Tomosynthesis and synthesized spot
compression tangential view of the area of concern in the RIGHT
breast was also obtained.

No mammographic abnormality in the area of the more focal pain in
the LOWER INNER RIGHT breast. Normal scattered fibroglandular tissue
is present in this location.

No findings suspicious for malignancy in either breast.

Mammographic images were processed with CAD.

On correlative physical exam, the tissues of the LOWER INNER and
LOWER OUTER RIGHT breast have a "lumpy bumpy" texture, though I do
not palpate a discrete mass in either area of patient concern.

Targeted RIGHT breast ultrasound is performed, showing normal
fibrofatty and scattered fibroglandular tissue in the area of focal
pain and fluctuating palpable concern at 4 o'clock position
approximately 3 cm from the nipple. Normal fibrofatty and scattered
fibroglandular tissue is also present at the 8 o'clock position
approximately 4 cm from the nipple in the area of fluctuating
palpable concern. No cyst, solid mass or abnormal acoustic shadowing
is identified.
IMPRESSION: 1. No mammographic or sonographic evidence of malignancy involving
the RIGHT breast.
2. No mammographic evidence of malignancy involving the LEFT breast.

RECOMMENDATION:
Screening mammogram at age 38 unless there are persistent or
intervening clinical concerns. (Code:[id number]-[id number])

Strategies for alleviating breast pain including decreasing caffeine
intake, vitamin-E supplementation, and avoidance of tight
compression brassieres, including underwire brassieres, were
discussed with the patient.

I have discussed the findings and recommendations with the patient.
If applicable, a reminder letter will be sent to the patient
regarding the next appointment.

BI-RADS CATEGORY  1: Negative.

## 2021-09-22 ENCOUNTER — Ambulatory Visit
Admission: EM | Admit: 2021-09-22 | Discharge: 2021-09-22 | Disposition: A | Discharge status: Home / Self Care | Payer: BC Managed Care – PPO | Attending: Emergency Medicine | Admitting: Emergency Medicine

## 2021-09-22 ENCOUNTER — Encounter: Payer: Self-pay | Admitting: Emergency Medicine

## 2021-09-22 ENCOUNTER — Other Ambulatory Visit: Payer: Self-pay

## 2021-09-22 DIAGNOSIS — R030 Elevated blood-pressure reading, without diagnosis of hypertension: Secondary | ICD-10-CM

## 2021-09-22 DIAGNOSIS — H81392 Other peripheral vertigo, left ear: Secondary | ICD-10-CM | POA: Diagnosis not present

## 2021-09-22 MED ORDER — MECLIZINE HCL 12.5 MG PO TABS: 12.5000 mg | ORAL_TABLET | Freq: Three times a day (TID) | ORAL | 0 refills | Status: DC | PRN

## 2021-09-22 MED ORDER — AMLODIPINE BESYLATE 2.5 MG PO TABS: 2.5000 mg | ORAL_TABLET | Freq: Every day | ORAL | 0 refills | Status: DC

## 2021-09-22 NOTE — ED Provider Notes (Signed)
HPI  SUBJECTIVE:  Casey Schroeder is a 31 y.o. female who presents with the acute onset onset of dizziness/vertigo that lasted about 5 minutes yesterday after turning her head.  She reports left ear "whooshing".  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  The school nurse checked it later in the afternoon, it was 154/102.  She rechecked her blood pressure with the school nurse this morning and this afternoon, and was 146/100, 154/100.  She has had intermittent episodes of dizziness described as a "up-and-down feeling" as if she feels that she is on an ocean.  No further episodes of vertigo.  No ear pain, palpitations, chest pain, shortness of breath, diaphoresis, headache, visual changes, arm or leg weakness, slurred speech, discoordination.  No syncope, seizure, lower extremity edema, anuria, hematuria, abdominal pain.  She drinks 1 energy drink a day, this is not new or different.  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  She tried pushing fluids without improvement in her symptoms.  She feels better when she sleeps.  There are no aggravating factors.  Her dizziness is not associated with turning her head.  No recent decongestant use, herbal supplements, or weight loss supplements.  No recent viral illnesses.  She has a past medical history of vertigo secondary to bilateral otitis media and a history of a cardiac murmur evaluated by cardiology.  No history of diabetes, coronary disease, arrhythmia, stroke.  No immediate family history of hypertension.  LMP: Now.  Denies possibility being pregnant.  PMD: None    Past Medical History:  Diagnosis Date   Appendicitis, acute 05/17/2013   Heart murmur    Uterine mass 05/17/2013    Past Surgical History:  Procedure Laterality Date   APPENDECTOMY     LAPAROSCOPIC APPENDECTOMY N/A 05/16/2013   Procedure:  APPENDECTOMY LAPAROSCOPIC uterine biopsy;  Surgeon: Leighton Ruff, MD;  Location: [facility];  Service: General;  Laterality: N/A;    Family History  Problem Relation Age of Onset   Cancer Maternal Aunt        breast   Breast cancer Maternal Aunt 30    Social History   Tobacco Use   Smoking status: Never   Smokeless tobacco: Never  Vaping Use   Vaping Use: Never used  Substance Use Topics   Alcohol use: Yes    Alcohol/week: 3.0 standard drinks    Types: 3 Cans of beer per week   Drug use: No    No current facility-administered medications for this encounter.  Current Outpatient Medications:    amLODipine (NORVASC) 2.5 MG tablet, Take 1 tablet (2.5 mg total) by mouth daily., Disp: 30 tablet, Rfl: 0   meclizine (ANTIVERT) 12.5 MG tablet, Take 1 tablet (12.5 mg total) by mouth 3 (three) times daily as needed for dizziness., Disp: 30 tablet, Rfl: 0   calcium carbonate (TUMS - DOSED IN MG ELEMENTAL CALCIUM) 500 MG chewable tablet, Chew 1 tablet by mouth daily., Disp: , Rfl:   No Known Allergies   ROS  As noted in HPI.   Physical Exam  BP (!) 146/90 (BP Location: Left Arm)    Pulse 87    Temp 99.1 F (37.3 C) (Oral)    Resp 16    LMP 09/18/2021    SpO2 100%   BP Readings from Last 3 Encounters:  09/22/21 Marland Kitchen)  146/90  11/16/17 129/87  10/07/17 138/89     Constitutional: Well developed, well nourished, no acute distress Eyes:  EOMI, conjunctiva normal bilaterally PERRLA HENT: Normocephalic, atraumatic,mucus membranes moist.  TMs normal bilaterally. Respiratory: Normal inspiratory effort Cardiovascular: Normal rate, regular rhythm, no murmurs.  No carotid bruit GI: nondistended skin: No rash, skin intact Musculoskeletal: No lower extremity edema Neurologic: Alert & oriented x 3, no nerves III through XII intact.  Tandem gait steady.  Romberg negative.  Positive Dix-Hallpike on the left.  Speech fluent. Psychiatric: Speech and behavior appropriate   ED  Course   Medications - No data to display  No orders of the defined types were placed in this encounter.   No results found for this or any previous visit (from the past 24 hour(s)). No results found.  ED Clinical Impression  1. Elevated blood-pressure reading, without diagnosis of hypertension   2. Peripheral vertigo involving left ear      ED Assessment/Plan  1.  Pt hypertensive today.  It is higher than it has been on previous visits.  She has had over 3 readings of elevated blood pressure on a healthcare professional's blood pressure cuff.  Will start on amlodipine 2.5 mg daily.  No historical evidence of endorgan damage as noted in HPI.  Discussed importance of lifestyle modifications as important first steps, will refer to primary care provider.  Pt to f/u as OP.   2.  Vertigo.  Appears to be peripheral.  She has a positive Dix-Hallpike on the left.  Home with Epley maneuver on the left and meclizine.   Discussed MDM, treatment plan, and plan for follow-up with patient. Discussed sn/sx that should prompt return to the ED. patient agrees with plan.   Meds ordered this encounter  Medications   amLODipine (NORVASC) 2.5 MG tablet    Sig: Take 1 tablet (2.5 mg total) by mouth daily.    Dispense:  30 tablet    Refill:  0   meclizine (ANTIVERT) 12.5 MG tablet    Sig: Take 1 tablet (12.5 mg total) by mouth 3 (three) times daily as needed for dizziness.    Dispense:  30 tablet    Refill:  0      *This clinic note was created using [employer]. Therefore, there may be occasional mistakes despite careful proofreading.  ?    Melynda Ripple, MD 09/22/21 [phone]

## 2021-09-22 NOTE — ED Triage Notes (Signed)
Pt reports a BP of 160/104 yesterday while at work. Her BP was checked due to her feeling like she was going to pass out.

## 2021-09-22 NOTE — Discharge Instructions (Addendum)
I am starting you on low-dose amlodipine to see if this works.  I do not want to drop your blood pressure too rapidly.  Decrease your salt intake. diet and exercise will lower your blood pressure significantly. It is important to keep your blood pressure under good control, as having a elevated blood pressure for prolonged periods of time significantly increases your risk of stroke, heart attacks, kidney damage, eye damage, and other problems. Measure your blood pressure once a day, preferably at the same time every day. Keep a log of this and bring it to your next doctor's appointment.  Bring your blood pressure cuff as well.  Return here in 2 weeks for blood pressure recheck if you're unable to find a primary care physician by then. Return immediately to the ER if you start having chest pain, headache, problems seeing, problems talking, problems walking, if you feel like you're about to pass out, if you do pass out, if you have a seizure, or for any other concerns.  Do the Epley maneuver on the left.  Try meclizine if the vertigo is a persistent issue  Go to www.goodrx.com  or www.costplusdrugs.com to look up your medications. This will give you a list of where you can find your prescriptions at the most affordable prices. Or ask the pharmacist what the cash price is, or if they have any other discount programs available to help make your medication more affordable. This can be less expensive than what you would pay with insurance.

## 2021-09-23 ENCOUNTER — Encounter (HOSPITAL_COMMUNITY): Payer: Self-pay

## 2021-11-02 ENCOUNTER — Encounter: Payer: Self-pay | Admitting: Nurse Practitioner

## 2021-11-02 ENCOUNTER — Other Ambulatory Visit: Payer: Self-pay

## 2021-11-02 ENCOUNTER — Ambulatory Visit: Payer: BC Managed Care – PPO | Admitting: Nurse Practitioner

## 2021-11-02 VITALS — BP 138/76 | HR 100 | Temp 98.5°F | Resp 16 | Ht 64.0 in | Wt 228.5 lb

## 2021-11-02 DIAGNOSIS — I10 Essential (primary) hypertension: Secondary | ICD-10-CM

## 2021-11-02 DIAGNOSIS — Z13220 Encounter for screening for lipoid disorders: Secondary | ICD-10-CM | POA: Diagnosis not present

## 2021-11-02 DIAGNOSIS — Z7689 Persons encountering health services in other specified circumstances: Secondary | ICD-10-CM

## 2021-11-02 DIAGNOSIS — Z131 Encounter for screening for diabetes mellitus: Secondary | ICD-10-CM | POA: Diagnosis not present

## 2021-11-02 DIAGNOSIS — Z1159 Encounter for screening for other viral diseases: Secondary | ICD-10-CM

## 2021-11-02 DIAGNOSIS — Z130 Encounter for screening for diseases of the blood and blood-forming organs and certain disorders involving the immune mechanism: Secondary | ICD-10-CM

## 2021-11-02 DIAGNOSIS — Z23 Encounter for immunization: Secondary | ICD-10-CM | POA: Diagnosis not present

## 2021-11-02 MED ORDER — AMLODIPINE BESYLATE 2.5 MG PO TABS: 2.5000 mg | ORAL_TABLET | Freq: Every day | ORAL | 1 refills | Status: DC

## 2021-11-02 NOTE — Progress Notes (Signed)
? ?BP 138/76   Pulse 100   Temp 98.5 ?F (36.9 ?C) (Oral)   Resp 16   Ht '5\' 4"'$  (1.626 m)   Wt 228 lb 8 oz (103.6 kg)   LMP 10/14/2021   SpO2 98%   BMI 39.22 kg/m?   ? ?Subjective:  ? ? Patient ID: Casey Schroeder, female    DOB: 10/31/1990, 31 y.o.   MRN: [id number] ? ?HPI: ?Casey Schroeder is a 31 y.o. female ? ?Chief Complaint  ?Patient presents with  ? Establish Care  ? Hypertension  ?  Seen at Urgent Care 2/28 and was diagnosed with HTN, BP 160/102  ? ?Establish care: Her last physical was in 2020. She says that everything was normal.  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 She says that her school nurse checked her blood pressure and it was 160/102 and then the next day is was 158/102.  She then went to urgent care and it was 150/100.  She says that she was given amlodipine. She was doing well and then ran out about nine days ago.  She does have a blood pressure cuff to check her blood pressure. She denies any chest pain, shortness of breath, headaches or blurred vision.  ? ?Relevant past medical, surgical, family and social history reviewed and updated as indicated. Interim medical history since our last visit reviewed. ?Allergies and medications reviewed and updated. ? ?Review of Systems ? ?Constitutional: Negative for fever or weight change.  ?Respiratory: Negative for cough and shortness of breath.   ?Cardiovascular: Negative for chest pain or palpitations.  ?Gastrointestinal: Negative for abdominal pain, no bowel changes.  ?Musculoskeletal: Negative for gait problem or joint swelling.  ?Skin: Negative for rash.  ?Neurological: Negative for dizziness or headache.  ?No other specific complaints in a complete review of systems (except as listed in HPI above).  ? ?   ?Objective:  ?  ?BP 138/76   Pulse 100   Temp 98.5 ?F (36.9 ?C) (Oral)   Resp 16   Ht '5\' 4"'$  (1.626 m)   Wt 228 lb 8 oz (103.6 kg)   LMP 10/14/2021   SpO2 98%   BMI 39.22 kg/m?   ?Wt Readings  from Last 3 Encounters:  ?11/02/21 228 lb 8 oz (103.6 kg)  ?10/07/17 197 lb (89.4 kg)  ?06/29/14 187 lb (84.8 kg)  ?  ?Physical Exam ? ?Constitutional: Patient appears well-developed and well-nourished. Obese  No distress.  ?HEENT: head atraumatic, normocephalic, pupils equal and reactive to light, neck supple ?Cardiovascular: Normal rate, regular rhythm and normal heart sounds.  No murmur heard. No BLE edema. ?Pulmonary/Chest: Effort normal and breath sounds normal. No respiratory distress. ?Abdominal: Soft.  There is no tenderness. ?Psychiatric: Patient has a normal mood and affect. behavior is normal. Judgment and thought content normal.  ? ?Results for orders placed or performed in visit on 10/17/18  ?Cytology - PAP  ?Result Value Ref Range  ? Adequacy    ?  Satisfactory for evaluation  endocervical/transformation zone component PRESENT.  ? Diagnosis    ?  NEGATIVE FOR INTRAEPITHELIAL LESIONS OR MALIGNANCY.  ? Material Submitted CervicoVaginal Pap [ThinPrep Imaged]   ? ?   ?Assessment & Plan:  ? ?1. Essential hypertension ?-continue with current treatment plan ?-discussed decreasing salt intake ?- amLODipine (NORVASC) 2.5 MG tablet; Take 1 tablet (2.5 mg total) by mouth daily.  Dispense: 90 tablet; Refill: 1 ?- CBC with Differential/Platelet ?- COMPLETE METABOLIC PANEL WITH GFR ? ?  2. Encounter to establish care ?-schedule cpe, due for pap ? ?3. Screening for diabetes mellitus ? ?- Hemoglobin A1c ? ?4. Screening for cholesterol level ? ?- Lipid panel ? ?5. Screening for deficiency anemia ? ?- CBC with Differential/Platelet ? ?6. Encounter for hepatitis C screening test for low risk patient ? ?- Hepatitis C antibody ? ?7. Need for tetanus, diphtheria, and acellular pertussis (Tdap) vaccine ? ?- Tdap vaccine greater than or equal to 7yo IM  ? ?Follow up plan: ?Return in about 3 months (around 02/01/2022) for cpe. ? ? ? ? ? ?

## 2021-11-03 LAB — CBC WITH DIFFERENTIAL/PLATELET
Absolute Monocytes: 812 cells/uL (ref 200–950)
Basophils Absolute: 49 cells/uL (ref 0–200)
Basophils Relative: 0.7 %
Eosinophils Absolute: 168 cells/uL (ref 15–500)
Eosinophils Relative: 2.4 %
HCT: 44.1 % (ref 35.0–45.0)
Hemoglobin: 13.7 g/dL (ref 11.7–15.5)
Lymphs Abs: 2051 cells/uL (ref 850–3900)
MCH: 23.1 pg — ABNORMAL LOW (ref 27.0–33.0)
MCHC: 31.1 g/dL — ABNORMAL LOW (ref 32.0–36.0)
MCV: 74.4 fL — ABNORMAL LOW (ref 80.0–100.0)
MPV: 11.9 fL (ref 7.5–12.5)
Monocytes Relative: 11.6 %
Neutro Abs: 3920 cells/uL (ref 1500–7800)
Neutrophils Relative %: 56 %
Platelets: 293 10*3/uL (ref 140–400)
RBC: 5.93 10*6/uL — ABNORMAL HIGH (ref 3.80–5.10)
RDW: 15.1 % — ABNORMAL HIGH (ref 11.0–15.0)
Total Lymphocyte: 29.3 %
WBC: 7 10*3/uL (ref 3.8–10.8)

## 2021-11-03 LAB — LIPID PANEL
Cholesterol: 190 mg/dL (ref <200)
HDL: 62 mg/dL (ref >=50)
LDL Cholesterol (Calc): 106 mg/dL (calc) — ABNORMAL HIGH
Non-HDL Cholesterol (Calc): 128 mg/dL (calc) (ref <130)
Total CHOL/HDL Ratio: 3.1 (calc) (ref <5.0)
Triglycerides: 127 mg/dL (ref <150)

## 2021-11-03 LAB — HEPATITIS C ANTIBODY
Hepatitis C Ab: NONREACTIVE
SIGNAL TO CUT-OFF: 0.15 (ref <1.00)

## 2021-11-03 LAB — COMPLETE METABOLIC PANEL WITH GFR
AG Ratio: 1.3 (calc) (ref 1.0–2.5)
ALT: 20 U/L (ref 6–29)
AST: 17 U/L (ref 10–30)
Albumin: 4.3 g/dL (ref 3.6–5.1)
Alkaline phosphatase (APISO): 46 U/L (ref 31–125)
BUN: 14 mg/dL (ref 7–25)
CO2: 28 mmol/L (ref 20–32)
Calcium: 9.5 mg/dL (ref 8.6–10.2)
Chloride: 103 mmol/L (ref 98–110)
Creat: 0.78 mg/dL (ref 0.50–0.97)
Globulin: 3.2 g/dL (calc) (ref 1.9–3.7)
Glucose, Bld: 87 mg/dL (ref 65–99)
Potassium: 5 mmol/L (ref 3.5–5.3)
Sodium: 137 mmol/L (ref 135–146)
Total Bilirubin: 0.4 mg/dL (ref 0.2–1.2)
Total Protein: 7.5 g/dL (ref 6.1–8.1)
eGFR: 103 mL/min/{1.73_m2} (ref >=60)

## 2021-11-03 LAB — HEMOGLOBIN A1C
Hgb A1c MFr Bld: 5.5 % of total Hgb (ref <5.7)
Mean Plasma Glucose: 111 mg/dL
eAG (mmol/L): 6.2 mmol/L

## 2021-11-28 ENCOUNTER — Encounter: Payer: Self-pay | Admitting: Nurse Practitioner

## 2022-02-02 ENCOUNTER — Encounter: Payer: BC Managed Care – PPO | Admitting: Nurse Practitioner

## 2022-02-08 ENCOUNTER — Other Ambulatory Visit: Payer: Self-pay | Admitting: Nurse Practitioner

## 2022-02-08 ENCOUNTER — Encounter: Payer: Self-pay | Admitting: Nurse Practitioner

## 2022-02-08 DIAGNOSIS — I10 Essential (primary) hypertension: Secondary | ICD-10-CM

## 2022-02-08 MED ORDER — AMLODIPINE BESYLATE 2.5 MG PO TABS: 2.5000 mg | ORAL_TABLET | Freq: Every day | ORAL | 1 refills | Status: DC

## 2022-02-09 NOTE — Telephone Encounter (Signed)
Refilled 02/08/2022 #90 [id number] refills. Requested Prescriptions  Pending Prescriptions Disp Refills  . amLODipine (NORVASC) 2.5 MG tablet [Pharmacy Med Name: AMLODIPINE BESYLATE 2.5 MG TAB] 90 tablet 1    Sig: TAKE 1 TABLET BY MOUTH EVERY DAY     Cardiovascular: Calcium Channel Blockers 2 Passed - 02/08/2022 11:40 AM      Passed - Last BP in normal range    BP Readings from Last 1 Encounters:  11/02/21 138/76         Passed - Last Heart Rate in normal range    Pulse Readings from Last 1 Encounters:  11/02/21 100         Passed - Valid encounter within last 6 months    Recent Outpatient Visits          3 months ago Essential hypertension   [place], FNP      Future Appointments            In 4 weeks Reece Packer, Myna Hidalgo, [facility], [facility]

## 2022-03-09 ENCOUNTER — Encounter: Payer: Self-pay | Admitting: Nurse Practitioner

## 2022-03-09 ENCOUNTER — Other Ambulatory Visit (HOSPITAL_COMMUNITY)
Admission: RE | Admit: 2022-03-09 | Discharge: 2022-03-09 | Disposition: A | Discharge status: Home / Self Care | Payer: BC Managed Care – PPO | Source: Ambulatory Visit | Attending: Nurse Practitioner | Admitting: Nurse Practitioner

## 2022-03-09 ENCOUNTER — Ambulatory Visit (INDEPENDENT_AMBULATORY_CARE_PROVIDER_SITE_OTHER): Payer: BC Managed Care – PPO | Admitting: Nurse Practitioner

## 2022-03-09 VITALS — BP 118/80 | HR 85 | Temp 98.5°F | Resp 16 | Ht 64.0 in | Wt 235.7 lb

## 2022-03-09 DIAGNOSIS — Z124 Encounter for screening for malignant neoplasm of cervix: Secondary | ICD-10-CM | POA: Diagnosis not present

## 2022-03-09 DIAGNOSIS — Z0000 Encounter for general adult medical examination without abnormal findings: Secondary | ICD-10-CM

## 2022-03-09 NOTE — Progress Notes (Signed)
Name: Casey Schroeder   MRN: [id number]    DOB: 01-Mar-1991   Date:03/09/2022       Progress Note  Subjective  Chief Complaint  Chief Complaint  Patient presents with   Annual Exam    HPI  Patient presents for annual CPE.  Diet: lean meat, vegetables, nuts and fruit every once in awhile, she is trying to eat healthier Exercise: rowing machine and walking 30 minutes a day.   Sleep: 10-11 hours Last dental exam: 2020, discussed scheduling a dental appointment  Last eye exam: before 2020 maybe 2018-2019, discussed scheduling a eye exam  [place] Office Visit from 11/02/2021 in [facility]  AUDIT-C Score 1      Depression: Phq 9 is  negative    03/09/2022    2:47 PM 11/02/2021   10:32 AM  Depression screen PHQ 2/9  Decreased Interest 0 0  Down, Depressed, Hopeless 0 0  PHQ - 2 Score 0 0  Altered sleeping 0   Tired, decreased energy 0   Change in appetite 0   Feeling bad or failure about yourself  0   Trouble concentrating 0   Moving slowly or fidgety/restless 0   Suicidal thoughts 0   PHQ-9 Score 0   Difficult doing work/chores Not difficult at all    Hypertension: BP Readings from Last 3 Encounters:  03/09/22 118/80  11/02/21 138/76  09/22/21 (!) 146/90   Obesity: Wt Readings from Last 3 Encounters:  03/09/22 235 lb 11.2 oz (106.9 kg)  11/02/21 228 lb 8 oz (103.6 kg)  10/07/17 197 lb (89.4 kg)   BMI Readings from Last 3 Encounters:  03/09/22 40.46 kg/m  11/02/21 39.22 kg/m  10/07/17 32.78 kg/m     Vaccines:  HPV: up to at age 77 , ask insurance if age between 34-45  Shingrix: 19-64 yo and ask insurance if covered when patient above 68 yo Pneumonia:  educated and discussed with patient. Flu:  educated and discussed with patient.  Hep C Screening: 11/02/2021 STD testing and prevention (HIV/chl/gon/syphilis): 06/29/2014 Intimate partner violence:none Sexual History : not sexually active at this time Menstrual  History/LMP/Abnormal Bleeding: [facility]: 02/18/2022, regular Incontinence Symptoms: none  Breast cancer:  - Last Mammogram: 08/31/2019 diagnostic mammogram done, recommendations to start routine mammograms at age 55 - BRCA gene screening: none  Osteoporosis: Discussed high calcium and vitamin D supplementation, weight bearing exercises  Cervical cancer screening: 10/17/2018, patient is due for pap  Skin cancer: Discussed monitoring for atypical lesions  Colorectal cancer: no concern, does not qualify   Lung cancer:   Low Dose CT Chest recommended if Age 31-80 years, 20 pack-year currently smoking OR have quit w/in 15years. Patient does not qualify.   ECG: none  Advanced Care Planning: A voluntary discussion about advance care planning including the explanation and discussion of advance directives.  Discussed health care proxy and Living will, and the patient was able to identify a health care proxy as Dad.  Patient does not have a living will at present time. If patient does have living will, I have requested they bring this to the clinic to be scanned in to their chart.  Lipids: Lab Results  Component Value Date   CHOL 190 11/02/2021   Lab Results  Component Value Date   HDL 62 11/02/2021   Lab Results  Component Value Date   LDLCALC 106 (H) 11/02/2021   Lab Results  Component Value Date   TRIG 127 11/02/2021   Lab Results  Component Value Date   CHOLHDL 3.1 11/02/2021   No results found for: "LDLDIRECT"  Glucose: Glucose, Bld  Date Value Ref Range Status  11/02/2021 87 65 - 99 mg/dL Final    Comment:    .            Fasting reference interval .   05/16/2013 87 70 - 99 mg/dL Final    Patient Active Problem List   Diagnosis Date Noted   Essential hypertension 11/02/2021   Uterine mass 05/17/2013    Past Surgical History:  Procedure Laterality Date   APPENDECTOMY     LAPAROSCOPIC APPENDECTOMY N/A 05/16/2013   Procedure: APPENDECTOMY LAPAROSCOPIC uterine biopsy;   Surgeon: Leighton Ruff, MD;  Location: [facility];  Service: General;  Laterality: N/A;    Family History  Problem Relation Age of Onset   Cancer Maternal Aunt        breast   Breast cancer Maternal Aunt 30    Social History   Socioeconomic History   Marital status: Single    Spouse name: Not on file   Number of children: Not on file   Years of education: Not on file   Highest education level: Not on file  Occupational History   Not on file  Tobacco Use   Smoking status: Never   Smokeless tobacco: Never  Vaping Use   Vaping Use: Never used  Substance and Sexual Activity   Alcohol use: Yes    Alcohol/week: 3.0 standard drinks of alcohol    Types: 3 Cans of beer per week   Drug use: No   Sexual activity: Yes  Other Topics Concern   Not on file  Social History Narrative   6 grade school teacher   Social Determinants of Health   Financial Resource Strain: Low Risk  (03/09/2022)   Overall Financial Resource Strain (CARDIA)    Difficulty of Paying Living Expenses: Not hard at all  Food Insecurity: No Food Insecurity (03/09/2022)   Hunger Vital Sign    Worried About Running Out of Food in the Last Year: Never true    Ran Out of Food in the Last Year: Never true  Transportation Needs: No Transportation Needs (03/09/2022)   PRAPARE - Hydrologist (Medical): No    Lack of Transportation (Non-Medical): No  Physical Activity: Sufficiently Active (03/09/2022)   Exercise Vital Sign    Days of Exercise per Week: 7 days    Minutes of Exercise per Session: 30 min  Stress: No Stress Concern Present (03/09/2022)   [place]    Feeling of Stress : Only a little  Social Connections: Moderately Integrated (03/09/2022)   Social Connection and Isolation Panel [NHANES]    Frequency of Communication with Friends and Family: More than three times a week    Frequency of Social Gatherings with Friends  and Family: More than three times a week    Attends Religious Services: More than 4 times per year    Active Member of [employer] or Organizations: Yes    Attends Archivist Meetings: More than 4 times per year    Marital Status: Divorced  Intimate Partner Violence: Not At Risk (03/09/2022)   Humiliation, Afraid, Rape, and Kick questionnaire    Fear of Current or Ex-Partner: No    Emotionally Abused: No    Physically Abused: No    Sexually Abused: No     Current Outpatient Medications:  amLODipine (NORVASC) 2.5 MG tablet, Take 1 tablet (2.5 mg total) by mouth daily., Disp: 90 tablet, Rfl: 1  No Known Allergies   ROS  Constitutional: Negative for fever or weight change.  Respiratory: Negative for cough and shortness of breath.   Cardiovascular: Negative for chest pain or palpitations.  Gastrointestinal: Negative for abdominal pain, no bowel changes.  Musculoskeletal: Negative for gait problem or joint swelling.  Skin: Negative for rash.  Neurological: Negative for dizziness or headache.  No other specific complaints in a complete review of systems (except as listed in HPI above).   Objective  Vitals:   03/09/22 1439 03/09/22 1526  BP: 118/80   Pulse: (!) 109 85  Resp: 16   Temp: 98.5 F (36.9 C)   SpO2: 97%   Weight: 235 lb 11.2 oz (106.9 kg)   Height: '5\' 4"'  (1.626 m)     Body mass index is 40.46 kg/m.  Physical Exam  Constitutional: Patient appears well-developed and well-nourished. No distress.  HENT: Head: Normocephalic and atraumatic. Ears: B TMs ok, no erythema or effusion; Nose: Nose normal. Mouth/Throat: Oropharynx is clear and moist. No oropharyngeal exudate.  Eyes: Conjunctivae and EOM are normal. Pupils are equal, round, and reactive to light. No scleral icterus.  Neck: Normal range of motion. Neck supple. No JVD present. No thyromegaly present.  Cardiovascular: Normal rate, regular rhythm and normal heart sounds.  No murmur heard. No BLE  edema. Pulmonary/Chest: Effort normal and breath sounds normal. No respiratory distress. Abdominal: Soft. Bowel sounds are normal, no distension. There is no tenderness. no masses Breast: no lumps or masses, no nipple discharge or rashes FEMALE GENITALIA:  External genitalia normal External urethra normal Vaginal vault normal without discharge or lesions Cervix normal without discharge or lesions Bimanual exam normal without masses RECTAL: no rectal masses or hemorrhoids Musculoskeletal: Normal range of motion, no joint effusions. No gross deformities Neurological: he is alert and oriented to person, place, and time. No cranial nerve deficit. Coordination, balance, strength, speech and gait are normal.  Skin: Skin is warm and dry. No rash noted. No erythema.  Psychiatric: Patient has a normal mood and affect. behavior is normal. Judgment and thought content normal.   No results found for this or any previous visit (from the past 2160 hour(s)).    Fall Risk:    03/09/2022    2:40 PM 11/02/2021   10:32 AM  Fall Risk   Falls in the past year? 0 0  Number falls in past yr: 0 0  Injury with Fall? 0 0  Follow up  Falls evaluation completed     Functional Status Survey: Is the patient deaf or have difficulty hearing?: No Does the patient have difficulty seeing, even when wearing glasses/contacts?: No Does the patient have difficulty concentrating, remembering, or making decisions?: No Does the patient have difficulty walking or climbing stairs?: No Does the patient have difficulty dressing or bathing?: No Does the patient have difficulty doing errands alone such as visiting a doctor's office or shopping?: No   Assessment & Plan  1. Encounter for annual physical exam  - Cytology - PAP  2. Encounter for screening for cervical cancer  - Cytology - PAP   -USPSTF grade A and B recommendations reviewed with patient; age-appropriate recommendations, preventive care, screening  tests, etc discussed and encouraged; healthy living encouraged; see AVS for patient education given to patient -Discussed importance of 150 minutes of physical activity weekly, eat two servings of fish weekly, eat one serving  of tree nuts ( cashews, pistachios, pecans, almonds.Marland Kitchen) every other day, eat 6 servings of fruit/vegetables daily and drink plenty of water and avoid sweet beverages.

## 2022-03-11 LAB — CYTOLOGY - PAP
Adequacy: ABSENT
Comment: NEGATIVE
Diagnosis: NEGATIVE
High risk HPV: NEGATIVE

## 2022-08-22 ENCOUNTER — Other Ambulatory Visit: Payer: Self-pay | Admitting: Nurse Practitioner

## 2022-08-22 DIAGNOSIS — I10 Essential (primary) hypertension: Secondary | ICD-10-CM

## 2022-08-23 NOTE — Telephone Encounter (Signed)
Requested Prescriptions  Pending Prescriptions Disp Refills   amLODipine (NORVASC) 2.5 MG tablet [Pharmacy Med Name: AMLODIPINE BESYLATE 2.5 MG TAB] 90 tablet 0    Sig: TAKE 1 TABLET BY MOUTH EVERY DAY     Cardiovascular: Calcium Channel Blockers 2 Passed - 08/22/2022  9:18 AM      Passed - Last BP in normal range    BP Readings from Last 1 Encounters:  03/09/22 118/80         Passed - Last Heart Rate in normal range    Pulse Readings from Last 1 Encounters:  03/09/22 85         Passed - Valid encounter within last 6 months    Recent Outpatient Visits           5 months ago Encounter for annual physical exam   [facility] Bo Merino, FNP   9 months ago Essential hypertension   [facility] Bo Merino, FNP       Future Appointments             In 2 days Reece Packer, Myna Hidalgo, [facility], [place]   In 6 months Reece Packer, Myna Hidalgo, [facility], [facility]

## 2022-08-25 ENCOUNTER — Encounter: Payer: Self-pay | Admitting: Nurse Practitioner

## 2022-08-25 ENCOUNTER — Ambulatory Visit: Payer: BC Managed Care – PPO | Admitting: Nurse Practitioner

## 2022-08-25 ENCOUNTER — Other Ambulatory Visit: Payer: Self-pay

## 2022-08-25 VITALS — BP 126/82 | HR 90 | Temp 98.8°F | Resp 16 | Ht 64.0 in | Wt 225.9 lb

## 2022-08-25 DIAGNOSIS — H8110 Benign paroxysmal vertigo, unspecified ear: Secondary | ICD-10-CM

## 2022-08-25 DIAGNOSIS — R1032 Left lower quadrant pain: Secondary | ICD-10-CM | POA: Diagnosis not present

## 2022-08-25 NOTE — Progress Notes (Signed)
BP 126/82   Pulse 90   Temp 98.8 F (37.1 C) (Oral)   Resp 16   Ht '5\' 4"'$  (1.626 m)   Wt 225 lb 14.4 oz (102.5 kg)   LMP 08/15/2022   SpO2 99%   BMI 38.78 kg/m    Subjective:    Patient ID: Casey Schroeder, female    DOB: 24-Oct-1990, 32 y.o.   MRN: [id number]  HPI: Casey Schroeder is a 32 y.o. female  Chief Complaint  Patient presents with   Mass    In lower abdomen, sometimes swell and painful for 2 months   Abdominal pain/abdominal mass:she says that she noticed an abdominal LLQ mass about two months ago. she says that the mass  comes and goes.  She says she does have occasional abdominal pain. She says when she lays on her stomach she feels like she is laying on a baseball.  She denies any fever, constipation, diarrhea, and/or vomiting.  Will get labs and ct scan.   Dizziness:she says that she has been having dizzy spells on and off for awhile but more consistently in the last two months. She says that turning her head back and forth does make her dizzy.  She denies any recent illness. She denies any chest pain, shortness of breath, no numbness, tingling, slurred speech, change in mentation. Likely BPV, will refer to ent, also recommend patient start zyrtec. Gave instructions on epley maneuver.   Relevant past medical, surgical, family and social history reviewed and updated as indicated. Interim medical history since our last visit reviewed. Allergies and medications reviewed and updated.  Review of Systems  Constitutional: Negative for fever or weight change.  Respiratory: Negative for cough and shortness of breath.   Cardiovascular: Negative for chest pain or palpitations.  Gastrointestinal: positive  for abdominal pain, no bowel changes.  Musculoskeletal: Negative for gait problem or joint swelling.  Skin: Negative for rash.  Neurological: positive for dizziness , negative for  headache.  No other specific complaints in a complete review of systems (except as  listed in HPI above).      Objective:    BP 126/82   Pulse 90   Temp 98.8 F (37.1 C) (Oral)   Resp 16   Ht '5\' 4"'$  (1.626 m)   Wt 225 lb 14.4 oz (102.5 kg)   LMP 08/15/2022   SpO2 99%   BMI 38.78 kg/m   Wt Readings from Last 3 Encounters:  08/25/22 225 lb 14.4 oz (102.5 kg)  03/09/22 235 lb 11.2 oz (106.9 kg)  11/02/21 228 lb 8 oz (103.6 kg)    Physical Exam  Constitutional: Patient appears well-developed and well-nourished. Obese  No distress.  HEENT: head atraumatic, normocephalic, pupils equal and reactive to light, ears TMs clear, neck supple, throat within normal limits Cardiovascular: Normal rate, regular rhythm and normal heart sounds.  No murmur heard. No BLE edema. Pulmonary/Chest: Effort normal and breath sounds normal. No respiratory distress. Abdominal: Soft.  Tenderness noted in the left upper, left lower and right lower abdomen, did not palpate mass Psychiatric: Patient has a normal mood and affect. behavior is normal. Judgment and thought content normal.      Assessment & Plan:   Problem List Items Addressed This Visit   None Visit Diagnoses     Left lower quadrant abdominal pain    -  Primary   will get labs, and ordered ct scan   Relevant Orders   CT Abdomen Pelvis W Contrast  CBC with Differential/Platelet   COMPLETE METABOLIC PANEL WITH GFR   Lipase   Amylase   Benign paroxysmal positional vertigo, unspecified laterality       start zyrtec, try epley maneuver, follow up with ent   Relevant Orders   Ambulatory referral to ENT        Follow up plan: Return if symptoms worsen or fail to improve.

## 2022-08-26 LAB — COMPLETE METABOLIC PANEL WITH GFR
AG Ratio: 1.5 (calc) (ref 1.0–2.5)
ALT: 14 U/L (ref 6–29)
AST: 17 U/L (ref 10–30)
Albumin: 4.6 g/dL (ref 3.6–5.1)
Alkaline phosphatase (APISO): 54 U/L (ref 31–125)
BUN: 11 mg/dL (ref 7–25)
CO2: 25 mmol/L (ref 20–32)
Calcium: 9.6 mg/dL (ref 8.6–10.2)
Chloride: 104 mmol/L (ref 98–110)
Creat: 0.71 mg/dL (ref 0.50–0.97)
Globulin: 3 g/dL (calc) (ref 1.9–3.7)
Glucose, Bld: 83 mg/dL (ref 65–99)
Potassium: 4.1 mmol/L (ref 3.5–5.3)
Sodium: 141 mmol/L (ref 135–146)
Total Bilirubin: 0.6 mg/dL (ref 0.2–1.2)
Total Protein: 7.6 g/dL (ref 6.1–8.1)
eGFR: 115 mL/min/{1.73_m2} (ref >=60)

## 2022-08-26 LAB — CBC WITH DIFFERENTIAL/PLATELET
Absolute Monocytes: 626 cells/uL (ref 200–950)
Basophils Absolute: 37 cells/uL (ref 0–200)
Basophils Relative: 0.4 %
Eosinophils Absolute: 37 cells/uL (ref 15–500)
Eosinophils Relative: 0.4 %
HCT: 43.3 % (ref 35.0–45.0)
Hemoglobin: 13.9 g/dL (ref 11.7–15.5)
Lymphs Abs: 2171 cells/uL (ref 850–3900)
MCH: 23.8 pg — ABNORMAL LOW (ref 27.0–33.0)
MCHC: 32.1 g/dL (ref 32.0–36.0)
MCV: 74.3 fL — ABNORMAL LOW (ref 80.0–100.0)
MPV: 13.1 fL — ABNORMAL HIGH (ref 7.5–12.5)
Monocytes Relative: 6.8 %
Neutro Abs: 6330 cells/uL (ref 1500–7800)
Neutrophils Relative %: 68.8 %
Platelets: 343 10*3/uL (ref 140–400)
RBC: 5.83 10*6/uL — ABNORMAL HIGH (ref 3.80–5.10)
RDW: 15.7 % — ABNORMAL HIGH (ref 11.0–15.0)
Total Lymphocyte: 23.6 %
WBC: 9.2 10*3/uL (ref 3.8–10.8)

## 2022-08-26 LAB — LIPASE: Lipase: 22 U/L (ref 7–60)

## 2022-08-26 LAB — AMYLASE: Amylase: 34 U/L (ref 21–101)

## 2022-08-31 ENCOUNTER — Other Ambulatory Visit: Payer: Self-pay | Admitting: Nurse Practitioner

## 2022-08-31 ENCOUNTER — Encounter: Payer: Self-pay | Admitting: Nurse Practitioner

## 2022-08-31 ENCOUNTER — Ambulatory Visit
Admission: RE | Admit: 2022-08-31 | Discharge: 2022-08-31 | Disposition: A | Discharge status: Home / Self Care | Payer: BC Managed Care – PPO | Source: Ambulatory Visit | Attending: Nurse Practitioner | Admitting: Nurse Practitioner

## 2022-08-31 DIAGNOSIS — D219 Benign neoplasm of connective and other soft tissue, unspecified: Secondary | ICD-10-CM

## 2022-08-31 DIAGNOSIS — R1032 Left lower quadrant pain: Secondary | ICD-10-CM | POA: Diagnosis not present

## 2022-08-31 MED ORDER — IOHEXOL 300 MG/ML  SOLN
100.0000 mL | Freq: Once | INTRAMUSCULAR | Status: AC | PRN
  Administered 2022-08-31: 100 mL via INTRAVENOUS

## 2022-09-02 ENCOUNTER — Other Ambulatory Visit: Payer: Self-pay | Admitting: Nurse Practitioner

## 2022-09-02 ENCOUNTER — Ambulatory Visit
Admission: RE | Admit: 2022-09-02 | Discharge: 2022-09-02 | Disposition: A | Discharge status: Home / Self Care | Payer: BC Managed Care – PPO | Source: Ambulatory Visit | Attending: Nurse Practitioner | Admitting: Nurse Practitioner

## 2022-09-02 DIAGNOSIS — R1032 Left lower quadrant pain: Secondary | ICD-10-CM

## 2022-09-03 ENCOUNTER — Encounter: Payer: Self-pay | Admitting: Nurse Practitioner

## 2022-09-06 ENCOUNTER — Other Ambulatory Visit: Payer: Self-pay | Admitting: Nurse Practitioner

## 2022-09-06 DIAGNOSIS — D219 Benign neoplasm of connective and other soft tissue, unspecified: Secondary | ICD-10-CM

## 2022-09-06 DIAGNOSIS — R1032 Left lower quadrant pain: Secondary | ICD-10-CM

## 2022-09-06 MED ORDER — NAPROXEN 500 MG PO TABS: 500.0000 mg | ORAL_TABLET | Freq: Two times a day (BID) | ORAL | 0 refills | Status: DC | PRN

## 2022-09-22 ENCOUNTER — Encounter: Payer: Self-pay | Admitting: Obstetrics and Gynecology

## 2022-09-22 ENCOUNTER — Ambulatory Visit: Payer: BC Managed Care – PPO | Admitting: Obstetrics and Gynecology

## 2022-09-22 VITALS — BP 139/92 | HR 90 | Ht 64.0 in | Wt 225.9 lb

## 2022-09-22 DIAGNOSIS — D219 Benign neoplasm of connective and other soft tissue, unspecified: Secondary | ICD-10-CM

## 2022-09-22 DIAGNOSIS — D259 Leiomyoma of uterus, unspecified: Secondary | ICD-10-CM | POA: Diagnosis not present

## 2022-09-22 DIAGNOSIS — Z7689 Persons encountering health services in other specified circumstances: Secondary | ICD-10-CM

## 2022-09-22 NOTE — Progress Notes (Signed)
Patient presents today due to recent ultrasound showing fibroids. She states this is new to her. She reports a history of regular menstrual cycles however very heavy with severe cramping, not on anything for cycle control. She states the pain is moderate to severe on and off her cycle but denies bleeding outside of her cycle. No additional concerns.

## 2022-09-22 NOTE — Progress Notes (Signed)
HPI:      Casey Schroeder is a 32 y.o. G1P0010 who LMP was Patient's last menstrual period was 09/01/2022 (exact date).  Subjective:   She presents today because she has been having daily abdominal/back/pelvic pain.  She was found to have uterine fibroids by ultrasound.  3 fairly large fibroids present.  She states her periods are heavy for a few days and regular but she has no intermenstrual bleeding.  The bleeding is not her issue.  The pelvic pain is. She is not currently sexually active-using no birth control.  She desires childbearing in the future.    Hx: The following portions of the patient's history were reviewed and updated as appropriate:             She  has a past medical history of Appendicitis, acute (05/17/2013), Heart murmur, Hypertension, and Uterine mass (05/17/2013). She does not have any pertinent problems on file. She  has a past surgical history that includes laparoscopic appendectomy (N/A, 05/16/2013) and Appendectomy. Her family history includes Breast cancer (age of onset: 23) in her maternal aunt; Cancer in her maternal aunt. She  reports that she has never smoked. She has never used smokeless tobacco. She reports current alcohol use of about 3.0 standard drinks of alcohol per week. She reports that she does not use drugs. She has a current medication list which includes the following prescription(s): amlodipine, multivitamin, and naproxen. She has No Known Allergies.       Review of Systems:  Review of Systems  Constitutional: Denied constitutional symptoms, night sweats, recent illness, fatigue, fever, insomnia and weight loss.  Eyes: Denied eye symptoms, eye pain, photophobia, vision change and visual disturbance.  Ears/Nose/Throat/Neck: Denied ear, nose, throat or neck symptoms, hearing loss, nasal discharge, sinus congestion and sore throat.  Cardiovascular: Denied cardiovascular symptoms, arrhythmia, chest pain/pressure, edema, exercise intolerance,  orthopnea and palpitations.  Respiratory: Denied pulmonary symptoms, asthma, pleuritic pain, productive sputum, cough, dyspnea and wheezing.  Gastrointestinal: Denied, gastro-esophageal reflux, melena, nausea and vomiting.  Genitourinary: See HPI for additional information.  Musculoskeletal: Denied musculoskeletal symptoms, stiffness, swelling, muscle weakness and myalgia.  Dermatologic: Denied dermatology symptoms, rash and scar.  Neurologic: Denied neurology symptoms, dizziness, headache, neck pain and syncope.  Psychiatric: Denied psychiatric symptoms, anxiety and depression.  Endocrine: Denied endocrine symptoms including hot flashes and night sweats.   Meds:   Current Outpatient Medications on File Prior to Visit  Medication Sig Dispense Refill   amLODipine (NORVASC) 2.5 MG tablet TAKE 1 TABLET BY MOUTH EVERY DAY 90 tablet 0   Multiple Vitamin (MULTIVITAMIN) capsule Take 1 capsule by mouth daily.     naproxen (NAPROSYN) 500 MG tablet Take 1 tablet (500 mg total) by mouth 2 (two) times daily as needed for moderate pain. 30 tablet 0   No current facility-administered medications on file prior to visit.      Objective:     Vitals:   09/22/22 0947 09/22/22 0959  BP: (!) 147/97 (!) 139/92  Pulse: 90 90   Filed Weights   09/22/22 0947  Weight: 225 lb 14.4 oz (102.5 kg)              Ultrasound results reviewed          Assessment:    G1P0010 Patient Active Problem List   Diagnosis Date Noted   Essential hypertension 11/02/2021   Uterine mass 05/17/2013     1. Establishing care with new doctor, encounter for   2. Fibroids  Large uterine fibroids causing pelvic pain/back pain.  Patient desires future childbearing   Plan:            1.  Fibroids Uterine fibroids were discussed in detail.  The natural course and history of fibroids were reviewed.  Multiple treatment options were also discussed including NSAIDS, hormonal options, IUD, UFE, myomectomy and  hysterectomy.  Menopause and its effect on fibroids was also reviewed. After evaluating the patient's situation I think her best option is UFE.  Literature given-referral to [facility] vascular.  Orders Orders Placed This Encounter  Procedures   Ambulatory referral to Vascular Surgery    No orders of the defined types were placed in this encounter.     F/U  No follow-ups on file. I spent 31 minutes involved in the care of this patient preparing to see the patient by obtaining and reviewing her medical history (including labs, imaging tests and prior procedures), documenting clinical information in the electronic health record (EHR), counseling and coordinating care plans, writing and sending prescriptions, ordering tests or procedures and in direct communicating with the patient and medical staff discussing pertinent items from her history and physical exam.  Finis Bud, M.D. 09/22/2022 10:35 AM

## 2022-09-23 ENCOUNTER — Other Ambulatory Visit: Payer: Self-pay | Admitting: Nurse Practitioner

## 2022-09-23 DIAGNOSIS — D219 Benign neoplasm of connective and other soft tissue, unspecified: Secondary | ICD-10-CM

## 2022-09-23 DIAGNOSIS — R1032 Left lower quadrant pain: Secondary | ICD-10-CM

## 2022-09-24 MED ORDER — NAPROXEN 500 MG PO TABS: 500.0000 mg | ORAL_TABLET | Freq: Two times a day (BID) | ORAL | 0 refills | Status: DC | PRN

## 2022-10-08 ENCOUNTER — Other Ambulatory Visit: Payer: Self-pay | Admitting: Obstetrics & Gynecology

## 2022-10-11 NOTE — Progress Notes (Signed)
Surgical Instructions    Your procedure is scheduled on Monday, March 25.  Report to [facility] Main Entrance "A" at 12:15 P.M., then check in with the Admitting office.  Call this number if you have problems the morning of surgery:  [phone]   If you have any questions prior to your surgery date call [phone]: Open Monday-Friday 8am-4pm If you experience any cold or flu symptoms such as cough, fever, chills, shortness of breath, etc. between now and your scheduled surgery, please notify us at the above number     Remember:  Do not eat after midnight the night before your surgery  You may drink clear liquids until 11:15 AM the morning of your surgery.   Clear liquids allowed are: Water, Non-Citrus Juices (without pulp), Carbonated Beverages, Clear Tea, Black Coffee ONLY (NO MILK, CREAM OR POWDERED CREAMER of any kind), and Gatorade    Take these medicines the morning of surgery with A SIP OF WATER:  amLODipine (NORVASC)    As of today, STOP taking any Aspirin (unless otherwise instructed by your surgeon) Aleve, Naproxen, Ibuprofen, Motrin, Advil, Goody's, BC's, all herbal medications, fish oil, and all vitamins.            [facility] is not responsible for any belongings or valuables.    Do NOT Smoke (Tobacco/Vaping)  24 hours prior to your procedure  If you use a CPAP at night, you may bring your mask for your overnight stay.   Contacts, glasses, hearing aids, dentures or partials may not be worn into surgery, please bring cases for these belongings   For patients admitted to the hospital, discharge time will be determined by your treatment team.   Patients discharged the day of surgery will not be allowed to drive home, and someone needs to stay with them for 24 hours.   SURGICAL WAITING ROOM VISITATION Patients having surgery or a procedure may have no more than 2 support people in the waiting area - these visitors may rotate.   Children under the age of 38 must  have an adult with them who is not the patient. If the patient needs to stay at the hospital during part of their recovery, the visitor guidelines for inpatient rooms apply. Pre-op nurse will coordinate an appropriate time for 1 support person to accompany patient in pre-op.  This support person may not rotate.   Please refer to [email or web address] for the visitor guidelines for Inpatients (after your surgery is over and you are in a regular room).    Special instructions:    Oral Hygiene is also important to reduce your risk of infection.  Remember - BRUSH YOUR TEETH THE MORNING OF SURGERY WITH YOUR REGULAR TOOTHPASTE   [facility]- Preparing For Surgery  Before surgery, you can play an important role. Because skin is not sterile, your skin needs to be as free of germs as possible. You can reduce the number of germs on your skin by washing with CHG (chlorahexidine gluconate) Soap before surgery.  CHG is an antiseptic cleaner which kills germs and bonds with the skin to continue killing germs even after washing.     Please do not use if you have an allergy to CHG or antibacterial soaps. If your skin becomes reddened/irritated stop using the CHG.  Do not shave (including legs and underarms) for at least 48 hours prior to first CHG shower. It is OK to shave your face.  Please follow these instructions carefully.     Shower  the NIGHT BEFORE SURGERY and the MORNING OF SURGERY with CHG Soap.   If you chose to wash your hair, wash your hair first as usual with your normal shampoo. After you shampoo, rinse your hair and body thoroughly to remove the shampoo.  Then [employer] and genitals (private parts) with your normal soap and rinse thoroughly to remove soap.  After that Use CHG Soap as you would any other liquid soap. You can apply CHG directly to the skin and wash gently with a scrungie or a clean washcloth.   Apply the CHG Soap to your body  ONLY FROM THE NECK DOWN.  Do not use on open wounds or open sores. Avoid contact with your eyes, ears, mouth and genitals (private parts). Wash Face and genitals (private parts)  with your normal soap.   Wash thoroughly, paying special attention to the area where your surgery will be performed.  Thoroughly rinse your body with warm water from the neck down.  DO NOT shower/wash with your normal soap after using and rinsing off the CHG Soap.  Pat yourself dry with a CLEAN TOWEL.  Wear CLEAN PAJAMAS to bed the night before surgery  Place CLEAN SHEETS on your bed the night before your surgery  DO NOT SLEEP WITH PETS.   Day of Surgery:  Take a shower with CHG soap. Wear Clean/Comfortable clothing the morning of surgery Do not wear jewelry or makeup. Do not wear lotions, powders, perfumes/cologne or deodorant. Do not shave 48 hours prior to surgery.  Men may shave face and neck. Do not bring valuables to the hospital. Do not wear nail polish, gel polish, artificial nails, or any other type of covering on natural nails (fingers and toes) If you have artificial nails or gel coating that need to be removed by a nail salon, please have this removed prior to surgery. Artificial nails or gel coating may interfere with anesthesia's ability to adequately monitor your vital signs. Remember to brush your teeth WITH YOUR REGULAR TOOTHPASTE.    If you received a COVID test during your pre-op visit, it is requested that you wear a mask when out in public, stay away from anyone that may not be feeling well, and notify your surgeon if you develop symptoms. If you have been in contact with anyone that has tested positive in the last 10 days, please notify your surgeon.    Please read over the following fact sheets that you were given.

## 2022-10-12 ENCOUNTER — Other Ambulatory Visit: Payer: Self-pay

## 2022-10-12 ENCOUNTER — Encounter (HOSPITAL_COMMUNITY)
Admission: RE | Admit: 2022-10-12 | Discharge: 2022-10-12 | Disposition: A | Discharge status: Home / Self Care | Payer: BC Managed Care – PPO | Source: Ambulatory Visit | Attending: Obstetrics & Gynecology | Admitting: Obstetrics & Gynecology

## 2022-10-12 ENCOUNTER — Encounter (HOSPITAL_COMMUNITY): Payer: Self-pay

## 2022-10-12 VITALS — BP 144/104 | HR 74 | Temp 98.3°F | Resp 17 | Ht 64.0 in | Wt 223.0 lb

## 2022-10-12 DIAGNOSIS — Z01818 Encounter for other preprocedural examination: Secondary | ICD-10-CM

## 2022-10-12 DIAGNOSIS — Z01812 Encounter for preprocedural laboratory examination: Secondary | ICD-10-CM | POA: Diagnosis not present

## 2022-10-12 LAB — BASIC METABOLIC PANEL
Anion gap: 9 (ref 5–15)
BUN: 6 mg/dL (ref 6–20)
CO2: 24 mmol/L (ref 22–32)
Calcium: 9.1 mg/dL (ref 8.9–10.3)
Chloride: 103 mmol/L (ref 98–111)
Creatinine, Ser: 0.75 mg/dL (ref 0.44–1.00)
GFR, Estimated: >60 mL/min (ref >60)
Glucose, Bld: 93 mg/dL (ref 70–99)
Potassium: 4.1 mmol/L (ref 3.5–5.1)
Sodium: 136 mmol/L (ref 135–145)

## 2022-10-12 LAB — CBC
HCT: 45.1 % (ref 36.0–46.0)
Hemoglobin: 14.2 g/dL (ref 12.0–15.0)
MCH: 23.8 pg — ABNORMAL LOW (ref 26.0–34.0)
MCHC: 31.5 g/dL (ref 30.0–36.0)
MCV: 75.7 fL — ABNORMAL LOW (ref 80.0–100.0)
Platelets: 342 10*3/uL (ref 150–400)
RBC: 5.96 MIL/uL — ABNORMAL HIGH (ref 3.87–5.11)
RDW: 17.8 % — ABNORMAL HIGH (ref 11.5–15.5)
WBC: 7.4 10*3/uL (ref 4.0–10.5)
nRBC: 0 % (ref 0.0–0.2)

## 2022-10-12 LAB — TYPE AND SCREEN
ABO/RH(D): B POS
Antibody Screen: NEGATIVE

## 2022-10-12 NOTE — Progress Notes (Addendum)
PCP - Serafina Royals, FNP Cardiologist - Denies  PPM/ICD - Denies  Chest x-ray - n/a EKG - today at PAT 10/12/2022 Stress Test - Denies ECHO - Denies Cardiac Cath - Denies  Sleep Study - Denies CPAP - Denies  Non-diabetic Last dose of GLP1 agonist- n/a  GLP1 instructions: n/a  Blood Thinner Instructions:Denies Aspirin Instructions:Denies  ERAS Protcol -Yes PRE-SURGERY Ensure   COVID TEST- Denies   Anesthesia review: Yes, Heart murmer and HTN.  Casey Caldwell PA-C evaluated patient during PAT for murmur  Patient denies shortness of breath, fever, cough and chest pain at PAT appointment   All instructions explained to the patient, with a verbal understanding of the material. Patient agrees to go over the instructions while at home for a better understanding. Patient also instructed to self quarantine after being tested for COVID-19. The opportunity to ask questions was provided.

## 2022-10-12 NOTE — H&P (Addendum)
Casey Schroeder is an 32 y.o. female was seen in office for 2nd opinion and transfer of care from her [facility] in [place], pt reports she was not given options for her fibroids except referral for UFE.  Pt is G1P0010 (SAB yrs back), single  at present and desires childbearing. She has abdominal pelvic pressure/ pain, some heavy menses and misses work. 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 No famHx of uterus/ colon cancer. . nl Pap hx. menses are q mo but now a heavier.   Pelvic sono TAS and TVS. Large fibroid uterus 16.7x9.8x9 cm, 517 cc. EMS 7.9 mm, normal. Multiple myomas noted- 8-10, 5 were measured, some IM and some SS. 4 cm fundal post, post 6.3 cm, Ant- 5.4 cm, another fundal ped- 6 cm, left ped- 6.7 cm, other 4 cm. Ovaries normal.   Fibroids d/w pt and sono images reviewed. Since she is not done w/ childbearing. we cannot have UFE option. Currently best option for bulk/pain and bleeding symptoms is myomectomy and based on uterus size and number of fibroids- recc open Abdominal myomectomy.  Patient's last menstrual period was 09/01/2022 (exact date).    Past Medical History:  Diagnosis Date   Appendicitis, acute 05/17/2013   Heart murmur    Hypertension    Uterine mass 05/17/2013    Past Surgical History:  Procedure Laterality Date   APPENDECTOMY     LAPAROSCOPIC APPENDECTOMY N/A 05/16/2013   Procedure: APPENDECTOMY LAPAROSCOPIC uterine biopsy;  Surgeon: Leighton Ruff, MD;  Location: [facility] ORS;  Service: General;  Laterality: N/A;    Family History  Problem Relation Age of Onset   Cancer Maternal Aunt        breast   Breast cancer Maternal Aunt 30    Social History:  reports that she has never smoked. She has never used smokeless tobacco. She reports current alcohol use of about 3.0 standard drinks of alcohol per week. She reports that she does not use drugs.  Allergies: No Known Allergies  No  medications prior to admission.    Review of Systems pressure in pelvis/ abdomen/ pain.  No SOB/ CP/ Dizziness   Last menstrual period 09/01/2022, unknown if currently breastfeeding. Physical Exam BP (!) 146/90   Pulse 95   Temp 99.1 F (37.3 C) (Oral)   Resp 15   Ht 5\' 4"  (1.626 m)   Wt 102.1 kg   LMP 09/01/2022 (Exact Date)   SpO2 100%   BMI 38.62 kg/m   Physical exam:  A&O x 3, no acute distress. Pleasant HEENT neg, no thyromegaly Lungs CTA bilat CV RRR, S1S2 normal Abdo soft, non tender, non acute. Large fibroid uterus 2 cm below umbilicus  Extr no edema/ tenderness Pelvic uterus enlarged at 18 wks. Multiple fibroids. Cervix normal   Results for orders placed or performed during the hospital encounter of 10/12/22 (from the past 24 hour(s))  Type and screen     Status: None   Collection Time: 10/12/22  8:30 AM  Result Value Ref Range   ABO/RH(D) B POS    Antibody Screen NEG    Sample Expiration 10/26/2022,2359    Extend sample reason      NO TRANSFUSIONS OR PREGNANCY IN THE PAST 3 MONTHS Performed at [facility], [address], [place], [place] [address]   CBC     Status: Abnormal   Collection Time: 10/12/22  8:41 AM  Result Value Ref Range  WBC 7.4 4.0 - 10.5 K/uL   RBC 5.96 (H) 3.87 - 5.11 MIL/uL   Hemoglobin 14.2 12.0 - 15.0 g/dL   HCT 45.1 36.0 - 46.0 %   MCV 75.7 (L) 80.0 - 100.0 fL   MCH 23.8 (L) 26.0 - 34.0 pg   MCHC 31.5 30.0 - 36.0 g/dL   RDW 17.8 (H) 11.5 - 15.5 %   Platelets 342 150 - 400 K/uL   nRBC 0.0 0.0 - 0.2 %  Basic metabolic panel     Status: None   Collection Time: 10/12/22  8:41 AM  Result Value Ref Range   Sodium 136 135 - 145 mmol/L   Potassium 4.1 3.5 - 5.1 mmol/L   Chloride 103 98 - 111 mmol/L   CO2 24 22 - 32 mmol/L   Glucose, Bld 93 70 - 99 mg/dL   BUN 6 6 - 20 mg/dL   Creatinine, Ser 0.75 0.44 - 1.00 mg/dL   Calcium 9.1 8.9 - 10.3 mg/dL   GFR, Estimated >60 >60 mL/min   Anion gap 9 5 - 15     Assessment/Plan: 32 yo female here for Abdominal myomectomy. We would like TAP block pre-op.  Procedure/ risks/ complications dw pt and what we do to minimize bleeding and adhesions. reviewed she will need C/s for all babies. reviewed blood loss risk, rare transfusion needed but start iron now and build up blood.  Risks/complications of surgery reviewed incl infection, bleeding, damage to internal organs including bladder, bowels, ureters, blood vessels, other risks from anesthesia, VTE and delayed complications of any surgery, complications in future surgery reviewed.   Elveria Royals  MD [facility]

## 2022-10-18 ENCOUNTER — Other Ambulatory Visit: Payer: Self-pay

## 2022-10-18 ENCOUNTER — Encounter (HOSPITAL_COMMUNITY)
Admission: RE | Disposition: A | Discharge status: Home / Self Care | Payer: Self-pay | Source: Ambulatory Visit | Attending: Obstetrics & Gynecology

## 2022-10-18 ENCOUNTER — Encounter (HOSPITAL_COMMUNITY): Payer: Self-pay | Admitting: Obstetrics & Gynecology

## 2022-10-18 ENCOUNTER — Inpatient Hospital Stay (HOSPITAL_COMMUNITY)
Admission: RE | Admit: 2022-10-18 | Discharge: 2022-10-20 | DRG: 743 | Disposition: A | Discharge status: Home / Self Care | Payer: BC Managed Care – PPO | Source: Ambulatory Visit | Attending: Obstetrics & Gynecology | Admitting: Obstetrics & Gynecology

## 2022-10-18 ENCOUNTER — Inpatient Hospital Stay (HOSPITAL_COMMUNITY): Payer: BC Managed Care – PPO | Admitting: Physician Assistant

## 2022-10-18 ENCOUNTER — Inpatient Hospital Stay (HOSPITAL_COMMUNITY): Payer: BC Managed Care – PPO | Admitting: Anesthesiology

## 2022-10-18 DIAGNOSIS — D259 Leiomyoma of uterus, unspecified: Principal | ICD-10-CM | POA: Diagnosis present

## 2022-10-18 DIAGNOSIS — D252 Subserosal leiomyoma of uterus: Secondary | ICD-10-CM | POA: Diagnosis present

## 2022-10-18 DIAGNOSIS — Z23 Encounter for immunization: Secondary | ICD-10-CM | POA: Diagnosis not present

## 2022-10-18 DIAGNOSIS — Z9889 Other specified postprocedural states: Secondary | ICD-10-CM

## 2022-10-18 DIAGNOSIS — D251 Intramural leiomyoma of uterus: Principal | ICD-10-CM | POA: Diagnosis present

## 2022-10-18 HISTORY — PX: MYOMECTOMY: SHX85

## 2022-10-18 LAB — POCT PREGNANCY, URINE: Preg Test, Ur: NEGATIVE

## 2022-10-18 LAB — ABO/RH: ABO/RH(D): B POS

## 2022-10-18 SURGERY — MYOMECTOMY, ABDOMINAL APPROACH
Anesthesia: General

## 2022-10-18 MED ORDER — DEXAMETHASONE SODIUM PHOSPHATE 10 MG/ML IJ SOLN
INTRAMUSCULAR | Status: DC | PRN
  Administered 2022-10-18: 10 mg via INTRAVENOUS

## 2022-10-18 MED ORDER — LIDOCAINE 2% (20 MG/ML) 5 ML SYRINGE
INTRAMUSCULAR | Status: AC
  Filled 2022-10-18: qty 5

## 2022-10-18 MED ORDER — MIDAZOLAM HCL 2 MG/2ML IJ SOLN
INTRAMUSCULAR | Status: AC
  Administered 2022-10-18: 2 mg via INTRAVENOUS
  Filled 2022-10-18: qty 2

## 2022-10-18 MED ORDER — EPHEDRINE 5 MG/ML INJ
INTRAVENOUS | Status: AC
  Filled 2022-10-18: qty 5

## 2022-10-18 MED ORDER — FENTANYL CITRATE (PF) 100 MCG/2ML IJ SOLN
INTRAMUSCULAR | Status: AC
  Filled 2022-10-18: qty 2

## 2022-10-18 MED ORDER — VASOPRESSIN 20 UNIT/ML IV SOLN
INTRAVENOUS | Status: DC | PRN
  Administered 2022-10-18: 40 mL via SURGICAL_CAVITY

## 2022-10-18 MED ORDER — ALBUMIN HUMAN 5 % IV SOLN: INTRAVENOUS | Status: DC | PRN

## 2022-10-18 MED ORDER — ACETAMINOPHEN 500 MG PO TABS: 1000.0000 mg | ORAL_TABLET | Freq: Once | ORAL | Status: AC

## 2022-10-18 MED ORDER — ACETAMINOPHEN 500 MG PO TABS
ORAL_TABLET | ORAL | Status: AC
  Administered 2022-10-18: 1000 mg via ORAL
  Filled 2022-10-18: qty 2

## 2022-10-18 MED ORDER — OXYCODONE HCL 5 MG PO TABS
5.0000 mg | ORAL_TABLET | ORAL | Status: DC | PRN
  Administered 2022-10-18 – 2022-10-19 (×6): 5 mg via ORAL
  Filled 2022-10-18 (×5): qty 1

## 2022-10-18 MED ORDER — LACTATED RINGERS IV SOLN: INTRAVENOUS | Status: DC

## 2022-10-18 MED ORDER — PROPOFOL 10 MG/ML IV BOLUS
INTRAVENOUS | Status: AC
  Filled 2022-10-18: qty 20

## 2022-10-18 MED ORDER — ROCURONIUM BROMIDE 10 MG/ML (PF) SYRINGE
PREFILLED_SYRINGE | INTRAVENOUS | Status: DC | PRN
  Administered 2022-10-18: 100 mg via INTRAVENOUS

## 2022-10-18 MED ORDER — ACETAMINOPHEN 325 MG PO TABS
650.0000 mg | ORAL_TABLET | ORAL | Status: DC | PRN
  Administered 2022-10-18 – 2022-10-19 (×5): 650 mg via ORAL
  Filled 2022-10-18 (×5): qty 2

## 2022-10-18 MED ORDER — SIMETHICONE 80 MG PO CHEW
80.0000 mg | CHEWABLE_TABLET | Freq: Four times a day (QID) | ORAL | Status: DC | PRN
  Administered 2022-10-19 (×2): 80 mg via ORAL
  Filled 2022-10-18 (×2): qty 1

## 2022-10-18 MED ORDER — BUPIVACAINE LIPOSOME 1.3 % IJ SUSP
INTRAMUSCULAR | Status: DC | PRN
  Administered 2022-10-18 (×2): 10 mL

## 2022-10-18 MED ORDER — BUPIVACAINE HCL (PF) 0.25 % IJ SOLN
INTRAMUSCULAR | Status: DC | PRN
  Administered 2022-10-18 (×2): 20 mL

## 2022-10-18 MED ORDER — MIDAZOLAM HCL 2 MG/2ML IJ SOLN
INTRAMUSCULAR | Status: AC
  Filled 2022-10-18: qty 2

## 2022-10-18 MED ORDER — MIDAZOLAM HCL 2 MG/2ML IJ SOLN: 2.0000 mg | Freq: Once | INTRAMUSCULAR | Status: AC

## 2022-10-18 MED ORDER — AMISULPRIDE (ANTIEMETIC) 5 MG/2ML IV SOLN
10.0000 mg | Freq: Once | INTRAVENOUS | Status: AC | PRN
  Administered 2022-10-18: 10 mg via INTRAVENOUS

## 2022-10-18 MED ORDER — CHLORHEXIDINE GLUCONATE 0.12 % MT SOLN: 15.0000 mL | Freq: Once | OROMUCOSAL | Status: AC

## 2022-10-18 MED ORDER — AMLODIPINE BESYLATE 5 MG PO TABS
2.5000 mg | ORAL_TABLET | Freq: Every day | ORAL | Status: DC
  Administered 2022-10-19 – 2022-10-20 (×2): 2.5 mg via ORAL
  Filled 2022-10-18 (×2): qty 1

## 2022-10-18 MED ORDER — OXYCODONE HCL 5 MG PO TABS
ORAL_TABLET | ORAL | Status: AC
  Filled 2022-10-18: qty 1

## 2022-10-18 MED ORDER — CEFAZOLIN SODIUM-DEXTROSE 2-4 GM/100ML-% IV SOLN
INTRAVENOUS | Status: AC
  Filled 2022-10-18: qty 100

## 2022-10-18 MED ORDER — CEFAZOLIN SODIUM-DEXTROSE 2-4 GM/100ML-% IV SOLN
2.0000 g | INTRAVENOUS | Status: AC
  Administered 2022-10-18: 2 g via INTRAVENOUS

## 2022-10-18 MED ORDER — DOCUSATE SODIUM 100 MG PO CAPS
100.0000 mg | ORAL_CAPSULE | Freq: Two times a day (BID) | ORAL | Status: DC
  Administered 2022-10-18 – 2022-10-19 (×3): 100 mg via ORAL
  Filled 2022-10-18 (×3): qty 1

## 2022-10-18 MED ORDER — SENNA 8.6 MG PO TABS
1.0000 | ORAL_TABLET | Freq: Two times a day (BID) | ORAL | Status: DC
  Administered 2022-10-18 – 2022-10-20 (×4): 8.6 mg via ORAL
  Filled 2022-10-18 (×4): qty 1

## 2022-10-18 MED ORDER — SCOPOLAMINE 1 MG/3DAYS TD PT72: 1.0000 | MEDICATED_PATCH | TRANSDERMAL | Status: DC

## 2022-10-18 MED ORDER — FENTANYL CITRATE (PF) 100 MCG/2ML IJ SOLN
INTRAMUSCULAR | Status: AC
  Administered 2022-10-18: 100 ug via INTRAVENOUS
  Filled 2022-10-18: qty 2

## 2022-10-18 MED ORDER — ONDANSETRON HCL 4 MG/2ML IJ SOLN
INTRAMUSCULAR | Status: AC
  Filled 2022-10-18: qty 2

## 2022-10-18 MED ORDER — SUGAMMADEX SODIUM 200 MG/2ML IV SOLN
INTRAVENOUS | Status: DC | PRN
  Administered 2022-10-18: 200 mg via INTRAVENOUS

## 2022-10-18 MED ORDER — MIDAZOLAM HCL 2 MG/2ML IJ SOLN
INTRAMUSCULAR | Status: DC | PRN
  Administered 2022-10-18 (×2): 1 mg via INTRAVENOUS

## 2022-10-18 MED ORDER — FENTANYL CITRATE (PF) 100 MCG/2ML IJ SOLN: 100.0000 ug | Freq: Once | INTRAMUSCULAR | Status: AC

## 2022-10-18 MED ORDER — INFLUENZA VAC SPLIT QUAD 0.5 ML IM SUSY
0.5000 mL | PREFILLED_SYRINGE | INTRAMUSCULAR | Status: AC
  Administered 2022-10-19: 0.5 mL via INTRAMUSCULAR
  Filled 2022-10-18: qty 0.5

## 2022-10-18 MED ORDER — PROPOFOL 10 MG/ML IV BOLUS
INTRAVENOUS | Status: DC | PRN
  Administered 2022-10-18: 150 mg via INTRAVENOUS
  Administered 2022-10-18: 50 mg via INTRAVENOUS

## 2022-10-18 MED ORDER — BUPIVACAINE HCL (PF) 0.25 % IJ SOLN
INTRAMUSCULAR | Status: AC
  Filled 2022-10-18: qty 30

## 2022-10-18 MED ORDER — DEXAMETHASONE SODIUM PHOSPHATE 10 MG/ML IJ SOLN
INTRAMUSCULAR | Status: AC
  Filled 2022-10-18: qty 1

## 2022-10-18 MED ORDER — POVIDONE-IODINE 10 % EX SWAB
2.0000 | Freq: Once | CUTANEOUS | Status: AC
  Administered 2022-10-18: 2 via TOPICAL

## 2022-10-18 MED ORDER — PROMETHAZINE HCL 25 MG/ML IJ SOLN: 6.2500 mg | INTRAMUSCULAR | Status: DC | PRN

## 2022-10-18 MED ORDER — ONDANSETRON HCL 4 MG/2ML IJ SOLN: 4.0000 mg | Freq: Four times a day (QID) | INTRAMUSCULAR | Status: DC | PRN

## 2022-10-18 MED ORDER — ONDANSETRON HCL 4 MG PO TABS: 4.0000 mg | ORAL_TABLET | Freq: Four times a day (QID) | ORAL | Status: DC | PRN

## 2022-10-18 MED ORDER — ORAL CARE MOUTH RINSE: 15.0000 mL | Freq: Once | OROMUCOSAL | Status: AC

## 2022-10-18 MED ORDER — ONDANSETRON HCL 4 MG/2ML IJ SOLN
INTRAMUSCULAR | Status: DC | PRN
  Administered 2022-10-18: 4 mg via INTRAVENOUS

## 2022-10-18 MED ORDER — FENTANYL CITRATE (PF) 250 MCG/5ML IJ SOLN
INTRAMUSCULAR | Status: DC | PRN
  Administered 2022-10-18 (×3): 50 ug via INTRAVENOUS
  Administered 2022-10-18: 100 ug via INTRAVENOUS

## 2022-10-18 MED ORDER — CHLORHEXIDINE GLUCONATE 0.12 % MT SOLN
OROMUCOSAL | Status: AC
  Administered 2022-10-18: 15 mL via OROMUCOSAL
  Filled 2022-10-18: qty 15

## 2022-10-18 MED ORDER — FENTANYL CITRATE (PF) 250 MCG/5ML IJ SOLN
INTRAMUSCULAR | Status: AC
  Filled 2022-10-18: qty 5

## 2022-10-18 MED ORDER — FENTANYL CITRATE (PF) 100 MCG/2ML IJ SOLN
25.0000 ug | INTRAMUSCULAR | Status: DC | PRN
  Administered 2022-10-18 (×3): 50 ug via INTRAVENOUS

## 2022-10-18 MED ORDER — KETOROLAC TROMETHAMINE 30 MG/ML IJ SOLN
INTRAMUSCULAR | Status: AC
  Filled 2022-10-18: qty 1

## 2022-10-18 MED ORDER — SCOPOLAMINE 1 MG/3DAYS TD PT72
MEDICATED_PATCH | TRANSDERMAL | Status: AC
  Administered 2022-10-18: 1.5 mg via TRANSDERMAL
  Filled 2022-10-18: qty 1

## 2022-10-18 MED ORDER — ROCURONIUM BROMIDE 10 MG/ML (PF) SYRINGE
PREFILLED_SYRINGE | INTRAVENOUS | Status: AC
  Filled 2022-10-18: qty 10

## 2022-10-18 MED ORDER — MENTHOL 3 MG MT LOZG: 1.0000 | LOZENGE | OROMUCOSAL | Status: DC | PRN

## 2022-10-18 MED ORDER — AMISULPRIDE (ANTIEMETIC) 5 MG/2ML IV SOLN
INTRAVENOUS | Status: AC
  Filled 2022-10-18: qty 4

## 2022-10-18 MED ORDER — SODIUM CHLORIDE (PF) 0.9 % IJ SOLN
INTRAMUSCULAR | Status: AC
  Filled 2022-10-18: qty 50

## 2022-10-18 MED ORDER — PHENYLEPHRINE 80 MCG/ML (10ML) SYRINGE FOR IV PUSH (FOR BLOOD PRESSURE SUPPORT)
PREFILLED_SYRINGE | INTRAVENOUS | Status: AC
  Filled 2022-10-18: qty 10

## 2022-10-18 SURGICAL SUPPLY — 41 items
APL SKNCLS STERI-STRIP NONHPOA (GAUZE/BANDAGES/DRESSINGS) ×2
BARRIER ADHS 3X4 INTERCEED (GAUZE/BANDAGES/DRESSINGS) IMPLANT
BENZOIN TINCTURE PRP APPL 2/3 (GAUZE/BANDAGES/DRESSINGS) ×1 IMPLANT
BRR ADH 4X3 ABS CNTRL BYND (GAUZE/BANDAGES/DRESSINGS) ×2
CANISTER SUCT 3000ML PPV (MISCELLANEOUS) ×1 IMPLANT
CLSR STERI-STRIP ANTIMIC 1/2X4 (GAUZE/BANDAGES/DRESSINGS) ×1 IMPLANT
CONT SPEC PATH 64OZ SNAP LID (MISCELLANEOUS) ×1 IMPLANT
DRAPE CESAREAN BIRTH W POUCH (DRAPES) ×1 IMPLANT
DRSG OPSITE POSTOP 4X10 (GAUZE/BANDAGES/DRESSINGS) ×1 IMPLANT
DURAPREP 26ML APPLICATOR (WOUND CARE) ×1 IMPLANT
ELECT NDL TIP 2.8 STRL (NEEDLE) ×1 IMPLANT
ELECT NEEDLE TIP 2.8 STRL (NEEDLE) ×2 IMPLANT
GAUZE 4X4 16PLY ~~LOC~~+RFID DBL (SPONGE) IMPLANT
GLOVE BIOGEL PI IND STRL 7.0 (GLOVE) ×2 IMPLANT
GLOVE SURG ENC MOIS LTX SZ6.5 (GLOVE) ×1 IMPLANT
GOWN STRL REUS W/ TWL LRG LVL3 (GOWN DISPOSABLE) ×3 IMPLANT
GOWN STRL REUS W/TWL LRG LVL3 (GOWN DISPOSABLE) ×3
KIT TURNOVER KIT B (KITS) ×1 IMPLANT
NEEDLE HYPO 22GX1.5 SAFETY (NEEDLE) ×2 IMPLANT
NS IRRIG 1000ML POUR BTL (IV SOLUTION) ×1 IMPLANT
PACK ABDOMINAL GYN (CUSTOM PROCEDURE TRAY) ×1 IMPLANT
PAD ARMBOARD 7.5X6 YLW CONV (MISCELLANEOUS) ×1 IMPLANT
PAD OB MATERNITY 4.3X12.25 (PERSONAL CARE ITEMS) ×1 IMPLANT
SPIKE FLUID TRANSFER (MISCELLANEOUS) ×1 IMPLANT
SPONGE T-LAP 18X18 ~~LOC~~+RFID (SPONGE) IMPLANT
STRIP CLOSURE SKIN 1/2X4 (GAUZE/BANDAGES/DRESSINGS) IMPLANT
SUT MON AB 2-0 CT1 27 (SUTURE) ×1 IMPLANT
SUT MON AB 2-0 SH 27 (SUTURE) ×1
SUT MON AB 2-0 SH27 (SUTURE) ×1 IMPLANT
SUT MON AB 3-0 SH 27 (SUTURE) ×4
SUT MON AB 3-0 SH27 (SUTURE) ×1 IMPLANT
SUT PLAIN 2 0 XLH (SUTURE) IMPLANT
SUT VIC AB 0 CT1 18XCR BRD8 (SUTURE) ×1 IMPLANT
SUT VIC AB 0 CT1 27 (SUTURE) ×2
SUT VIC AB 0 CT1 27XBRD ANBCTR (SUTURE) ×2 IMPLANT
SUT VIC AB 0 CT1 8-18 (SUTURE) ×3
SUT VIC AB 4-0 KS 27 (SUTURE) ×1 IMPLANT
SYR CONTROL 10ML LL (SYRINGE) ×2 IMPLANT
TAPE STRIPS DRAPE STRL (GAUZE/BANDAGES/DRESSINGS) IMPLANT
TOWEL GREEN STERILE FF (TOWEL DISPOSABLE) ×2 IMPLANT
TRAY FOLEY W/BAG SLVR 14FR (SET/KITS/TRAYS/PACK) ×1 IMPLANT

## 2022-10-18 NOTE — Anesthesia Preprocedure Evaluation (Signed)
Anesthesia Evaluation  Patient identified by MRN, date of birth, ID band Patient awake    Reviewed: Allergy & Precautions, H&P , NPO status , Patient's Chart, lab work & pertinent test results  Airway Mallampati: III  TM Distance: >3 FB Neck ROM: Full    Dental no notable dental hx. (+) Dental Advisory Given   Pulmonary neg pulmonary ROS   Pulmonary exam normal        Cardiovascular hypertension, Pt. on medications Normal cardiovascular exam     Neuro/Psych negative neurological ROS  negative psych ROS   GI/Hepatic negative GI ROS, Neg liver ROS,,,  Endo/Other  negative endocrine ROS    Renal/GU negative Renal ROS  negative genitourinary   Musculoskeletal negative musculoskeletal ROS (+)    Abdominal   Peds negative pediatric ROS (+)  Hematology negative hematology ROS (+)   Anesthesia Other Findings   Reproductive/Obstetrics                             Anesthesia Physical Anesthesia Plan  ASA: 2  Anesthesia Plan: General   Post-op Pain Management: Tylenol PO (pre-op)* and Toradol IV (intra-op)*   Induction: Intravenous  PONV Risk Score and Plan: 4 or greater and Ondansetron, Dexamethasone, Midazolam and Scopolamine patch - Pre-op  Airway Management Planned: Oral ETT  Additional Equipment:   Intra-op Plan:   Post-operative Plan: Extubation in OR  Informed Consent: I have reviewed the patients History and Physical, chart, labs and discussed the procedure including the risks, benefits and alternatives for the proposed anesthesia with the patient or authorized representative who has indicated his/her understanding and acceptance.     Dental advisory given  Plan Discussed with: Anesthesiologist and CRNA  Anesthesia Plan Comments:        Anesthesia Quick Evaluation

## 2022-10-18 NOTE — Transfer of Care (Signed)
Immediate Anesthesia Transfer of Care Note  Patient: Casey Schroeder  Procedure(s) Performed: ABDOMINAL MYOMECTOMY  Patient Location: PACU  Anesthesia Type:General  Level of Consciousness: awake, alert , and oriented  Airway & Oxygen Therapy: Patient Spontanous Breathing  Post-op Assessment: Report given to RN  Post vital signs: Reviewed and stable  Last Vitals:  Vitals Value Taken Time  BP 141/83 10/18/22 1747  Temp    Pulse 90 10/18/22 1749  Resp 17 10/18/22 1749  SpO2 99 % 10/18/22 1749  Vitals shown include unvalidated device data.  Last Pain:  Vitals:   10/18/22 1228  TempSrc:   PainSc: 0-No pain         Complications: No notable events documented.

## 2022-10-18 NOTE — Anesthesia Procedure Notes (Addendum)
Procedure Name: Intubation Date/Time: 10/18/2022 2:53 PM  Performed by: Reggie Pile, CRNAPre-anesthesia Checklist: Patient identified, Emergency Drugs available, Suction available and Patient being monitored Patient Re-evaluated:Patient Re-evaluated prior to induction Oxygen Delivery Method: Circle system utilized Preoxygenation: Pre-oxygenation with 100% oxygen Induction Type: IV induction Ventilation: Mask ventilation without difficulty Laryngoscope Size: Miller and 2 Grade View: Grade I Tube type: Oral Tube size: 7.0 mm Number of attempts: 1 Airway Equipment and Method: Stylet and Oral airway Placement Confirmation: ETT inserted through vocal cords under direct vision, positive ETCO2 and breath sounds checked- equal and bilateral Tube secured with: Tape Dental Injury: Teeth and Oropharynx as per pre-operative assessment

## 2022-10-18 NOTE — Anesthesia Postprocedure Evaluation (Signed)
Anesthesia Post Note  Patient: Casey Schroeder  Procedure(s) Performed: ABDOMINAL MYOMECTOMY     Patient location during evaluation: PACU Anesthesia Type: General Level of consciousness: patient cooperative, oriented and sedated Pain management: pain level controlled Vital Signs Assessment: post-procedure vital signs reviewed and stable Respiratory status: spontaneous breathing, nonlabored ventilation and respiratory function stable Cardiovascular status: blood pressure returned to baseline and stable Postop Assessment: no apparent nausea or vomiting Anesthetic complications: no   No notable events documented.  Last Vitals:  Vitals:   10/18/22 1405 10/18/22 1747  BP: (!) 146/90 (!) 141/83  Pulse: 95 93  Resp: 15 15  Temp:  37 C  SpO2: 100% 99%    Last Pain:  Vitals:   10/18/22 1747  TempSrc:   PainSc: 6                  Lavel Rieman,E. Sundra Haddix

## 2022-10-18 NOTE — Op Note (Signed)
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 Then admit for post-op care.   Delay start of Pharmacological VTE agent (>24hrs) due to surgical blood loss or risk of bleeding: yes  Procedure:  Patient brought to OR after informed written consent obtained. She had received TAP block in pre -op area.  Patient underwent general endotracheal anesthesia , foley placed. She was prepped and draped in standard fashion in supine position. She received 2 gm Ancef. Pfannenstiel incision made and carried to fascia which was incised, then muscles dissected and separated in midline and peritoneal cavity entered. Enlarged uterus with multiple myomas noted,  no adhesions noted. Uterus was gently manipulated out  of the incision, carefully watching tubes and ovaries which were normal but uterus was rotated and very distorted with fibroids.  20 u Pitressin in 50 cc saline used for myoma infiltration (we used 40 cc)  Fibroids mapping performed. 3 linear incisions were made to remove 8 large and several smaller myoma tuneling through 3 incisions, using needle tip cautery, towel clamps and hemostat for dissection. Left tube and ovarian ligament were quite close to left fundal large myoma but it was removed with no obvious concern for the tub, two of the three incisions were fundal to posterior and one was anterior.  One  4 cm myoma at left uterine vessel area was not removed due to concern for bleeding from uterine being exactly lateral position above uterine vessels as this could risk hysterectomy if  bleeding was worse. Endometrial cavity was not entered. Myoma beds were sutured in 2-3 layers with 0-Vicryl in figure of 8 stitches and serosa closed with 3-0 Monocryl in base-ball stitch fashion to keep sutured buried. Now hemostasis was excellent.  Irrigation performed to remove blood from cul de sac. Uterine incisions were covered with Interceed and uterus returned in pelvis significantly smaller.  Peritoneum was not closed but approximated in midline manually. Fascia closed with 0-Vicryl starting at two angles. Subcu with 2-0 plain gut and skin with 4-0 Vicryl.  Steristrips, Honeycomb and pressure dressing placed.   All counts were correct x2. No complications.  Dr Azucena Fallen was the primary surgeon for entire case.

## 2022-10-18 NOTE — Anesthesia Procedure Notes (Signed)
Anesthesia Regional Block: TAP block   Pre-Anesthetic Checklist: , timeout performed,  Correct Patient, Correct Site, Correct Laterality,  Correct Procedure, Correct Position, site marked,  Risks and benefits discussed,  Surgical consent,  Pre-op evaluation,  At surgeon's request and post-op pain management  Laterality: Left  Prep: chloraprep       Needles:  Injection technique: Single-shot  Needle Type: Echogenic Stimulator Needle     Needle Length: 10cm  Needle Gauge: 21     Additional Needles:   Procedures:,,,, ultrasound used (permanent image in chart),,    Narrative:  Start time: 10/18/2022 3:43 PM End time: 10/18/2022 1:51 PM Injection made incrementally with aspirations every 5 mL.  Performed by: Personally

## 2022-10-18 NOTE — Anesthesia Procedure Notes (Addendum)
Anesthesia Regional Block: TAP block   Pre-Anesthetic Checklist: , timeout performed,  Correct Patient, Correct Site, Correct Laterality,  Correct Procedure, Correct Position, site marked,  Risks and benefits discussed,  Surgical consent,  Pre-op evaluation,  At surgeon's request and post-op pain management  Laterality: Right  Prep: chloraprep       Needles:  Injection technique: Single-shot  Needle Type: Echogenic Stimulator Needle     Needle Length: 10cm  Needle Gauge: 21     Additional Needles:   Procedures:,,,, ultrasound used (permanent image in chart),,    Narrative:  Start time: 10/18/2022 1:51 PM End time: 10/18/2022 1:58 PM Injection made incrementally with aspirations every 5 mL.  Performed by: Personally

## 2022-10-19 ENCOUNTER — Encounter (HOSPITAL_COMMUNITY): Payer: Self-pay | Admitting: Obstetrics & Gynecology

## 2022-10-19 LAB — CBC
HCT: 32.6 % — ABNORMAL LOW (ref 36.0–46.0)
Hemoglobin: 10.4 g/dL — ABNORMAL LOW (ref 12.0–15.0)
MCH: 24 pg — ABNORMAL LOW (ref 26.0–34.0)
MCHC: 31.9 g/dL (ref 30.0–36.0)
MCV: 75.1 fL — ABNORMAL LOW (ref 80.0–100.0)
Platelets: 250 10*3/uL (ref 150–400)
RBC: 4.34 MIL/uL (ref 3.87–5.11)
RDW: 16.2 % — ABNORMAL HIGH (ref 11.5–15.5)
WBC: 16.4 10*3/uL — ABNORMAL HIGH (ref 4.0–10.5)
nRBC: 0 % (ref 0.0–0.2)

## 2022-10-19 MED ORDER — IBUPROFEN 600 MG PO TABS
600.0000 mg | ORAL_TABLET | Freq: Four times a day (QID) | ORAL | Status: DC
  Administered 2022-10-20 (×2): 600 mg via ORAL
  Filled 2022-10-19 (×2): qty 1

## 2022-10-19 MED ORDER — OXYCODONE HCL 5 MG PO TABS
10.0000 mg | ORAL_TABLET | Freq: Four times a day (QID) | ORAL | Status: DC | PRN
  Administered 2022-10-19 – 2022-10-20 (×2): 10 mg via ORAL
  Filled 2022-10-19 (×2): qty 2

## 2022-10-19 MED ORDER — IBUPROFEN 600 MG PO TABS
600.0000 mg | ORAL_TABLET | Freq: Four times a day (QID) | ORAL | Status: DC | PRN
  Administered 2022-10-19 (×2): 600 mg via ORAL
  Filled 2022-10-19 (×2): qty 1

## 2022-10-19 NOTE — Progress Notes (Signed)
Subjective: Patient reports no pain at incision. Has gas pain in upper abdomen. No n/v. Ate light food. Voiding well .    Objective: I have reviewed patient's vital signs, intake and output, medications, and labs.  General: alert and cooperative Resp: clear to auscultation bilaterally Cardio: regular rate and rhythm, S1, S2 normal, no murmur, click, rub or gallop GI: soft, non-tender; bowel sounds normal; no masses,  no organomegaly Extremities: Homans sign is negative, no sign of DVT   Assessment/Plan: POD #1. S/p abdominal myomectomy. Surgical findings dw pt and photographs shared.  Stable, doing well. Bowel and pain mngmnt today Anticipate Dc tomorrow   LOS: 1 day    Elveria Royals, MD 10/19/2022, 5:48 PM

## 2022-10-20 LAB — SURGICAL PATHOLOGY

## 2022-10-20 MED ORDER — ACETAMINOPHEN 325 MG PO TABS: 650.0000 mg | ORAL_TABLET | ORAL | Status: DC | PRN

## 2022-10-20 MED ORDER — OXYCODONE HCL 10 MG PO TABS: 10.0000 mg | ORAL_TABLET | Freq: Four times a day (QID) | ORAL | 0 refills | Status: DC | PRN

## 2022-10-20 MED ORDER — SENNA 8.6 MG PO TABS: 1.0000 | ORAL_TABLET | Freq: Two times a day (BID) | ORAL | 0 refills | Status: DC

## 2022-10-20 MED ORDER — SIMETHICONE 80 MG PO CHEW: 80.0000 mg | CHEWABLE_TABLET | Freq: Four times a day (QID) | ORAL | 0 refills | Status: DC | PRN

## 2022-10-20 MED ORDER — IBUPROFEN 600 MG PO TABS: 600.0000 mg | ORAL_TABLET | Freq: Four times a day (QID) | ORAL | 0 refills | Status: DC

## 2022-10-20 NOTE — Addendum Note (Signed)
Addendum  created 10/20/22 0945 by Duane Boston, MD   Clinical Note Signed, Intraprocedure Blocks edited, SmartForm saved

## 2022-10-20 NOTE — Discharge Summary (Signed)
Physician Discharge Summary  Patient ID: Casey Schroeder MRN: [id number] DOB/AGE: 34-03-1991 34 y.o.  Admit date: 10/18/2022 Discharge date: 10/20/2022  Admission Diagnoses:  Discharge Diagnoses:  Principal Problem:   Uterine fibroid Active Problems:   S/P myomectomy   Discharged Condition: {condition:18240}  Hospital Course: ***  Consults: {consultation:18241}  Significant Diagnostic Studies: {diagnostics:18242}  Treatments: {Tx:18249}  Discharge Exam: Blood pressure (!) 141/73, pulse 71, temperature 98.7 F (37.1 C), temperature source Oral, resp. rate 16, height 5\' 4"  (1.626 m), weight 102.1 kg, last menstrual period 09/01/2022, SpO2 93 %, unknown if currently breastfeeding. {physical SA:931536  Disposition: Discharge disposition: 01-Home or Self Care       Discharge Instructions     Activity as tolerated - No restrictions   Complete by: As directed    Call MD for:  difficulty breathing, headache or visual disturbances   Complete by: As directed    Call MD for:  hives   Complete by: As directed    Call MD for:  persistant dizziness or light-headedness   Complete by: As directed    Call MD for:  persistant nausea and vomiting   Complete by: As directed    Call MD for:  redness, tenderness, or signs of infection (pain, swelling, redness, odor or green/yellow discharge around incision site)   Complete by: As directed    Call MD for:  severe uncontrolled pain   Complete by: As directed    Call MD for:  temperature >100.4   Complete by: As directed    Diet - low sodium heart healthy   Complete by: As directed    Discharge wound care:   Complete by: As directed    Remove honeycomb dressing on Saturday 10/23/22   Driving Restrictions   Complete by: As directed    2 weeks and more as needed   Increase activity slowly   Complete by: As directed    Lifting restrictions   Complete by: As directed    15 pounds for 6 weeks   Sexual Activity Restrictions    Complete by: As directed    4 weeks      Allergies as of 10/20/2022       Reactions   Beef-derived Products    Chicken Protein    Fish-derived Products    Pork-derived Products         Medication List     STOP taking these medications    naproxen 500 MG tablet Commonly known as: NAPROSYN       TAKE these medications    acetaminophen 325 MG tablet Commonly known as: TYLENOL Take 2 tablets (650 mg total) by mouth every 4 (four) hours as needed for mild pain (temperature > 101.5.).   amLODipine 2.5 MG tablet Commonly known as: NORVASC TAKE 1 TABLET BY MOUTH EVERY DAY   FIBER PO Take 1 Scoop by mouth daily.   ibuprofen 600 MG tablet Commonly known as: ADVIL Take 1 tablet (600 mg total) by mouth every 6 (six) hours.   IRON PO Take 1 tablet by mouth daily.   multivitamin capsule Take 1 capsule by mouth daily.   Oxycodone HCl 10 MG Tabs Take 1 tablet (10 mg total) by mouth every 6 (six) hours as needed for severe pain or moderate pain.   senna 8.6 MG Tabs tablet Commonly known as: SENOKOT Take 1 tablet (8.6 mg total) by mouth 2 (two) times daily.   simethicone 80 MG chewable tablet Commonly known as: MYLICON Chew 1 tablet (  80 mg total) by mouth 4 (four) times daily as needed for flatulence.   VITAMIN C PO Take 1 tablet by mouth daily.               Discharge Care Instructions  (From admission, onward)           Start     Ordered   10/20/22 0000  Discharge wound care:       Comments: Remove honeycomb dressing on Saturday 10/23/22   10/20/22 0841            Follow-up Information     Azucena Fallen, MD Follow up in 2 week(s).   Specialty: Obstetrics and Gynecology Contact information: [address] [place] [address] [phone]                 Signed: Elveria Royals 10/20/2022, 8:42 AM

## 2022-10-20 NOTE — Plan of Care (Signed)
  Problem: Clinical Measurements: Goal: Ability to maintain clinical measurements within normal limits will improve Outcome: Adequate for Discharge Goal: Will remain free from infection Outcome: Adequate for Discharge   Problem: Activity: Goal: Risk for activity intolerance will decrease Outcome: Adequate for Discharge   Problem: Nutrition: Goal: Adequate nutrition will be maintained Outcome: Adequate for Discharge   Problem: Coping: Goal: Level of anxiety will decrease Outcome: Adequate for Discharge   Problem: Elimination: Goal: Will not experience complications related to bowel motility Outcome: Adequate for Discharge Goal: Will not experience complications related to urinary retention Outcome: Adequate for Discharge   Problem: Pain Managment: Goal: General experience of comfort will improve Outcome: Adequate for Discharge   Problem: Safety: Goal: Ability to remain free from injury will improve Outcome: Adequate for Discharge   Problem: Skin Integrity: Goal: Risk for impaired skin integrity will decrease Outcome: Adequate for Discharge   Problem: Education: Goal: Knowledge of the prescribed therapeutic regimen will improve Outcome: Adequate for Discharge Goal: Understanding of sexual limitations or changes related to disease process or condition will improve Outcome: Adequate for Discharge Goal: Individualized Educational Video(s) Outcome: Adequate for Discharge   Problem: Self-Concept: Goal: Communication of feelings regarding changes in body function or appearance will improve Outcome: Adequate for Discharge   Problem: Skin Integrity: Goal: Demonstration of wound healing without infection will improve Outcome: Adequate for Discharge

## 2022-10-20 NOTE — Progress Notes (Signed)
Subjective: Patient reports some pain at incision. Gas pain resolved, eating reg diet. No n/v.. Voiding well  flatus +.    Objective: I have reviewed patient's vital signs, intake and output, medications, and labs. BP (!) 141/73 (BP Location: Right Arm)   Pulse 71   Temp 98.7 F (37.1 C) (Oral)   Resp 16   Ht 5\' 4"  (1.626 m)   Wt 102.1 kg   LMP 09/01/2022 (Exact Date)   SpO2 93%   BMI 38.62 kg/m   General: alert and cooperative Resp: clear to auscultation bilaterally Cardio: regular rate and rhythm, S1, S2 normal, no murmur, click, rub or gallop GI: soft, non-tender; bowel sounds normal; no masses,  no organomegaly Extremities: Homans sign is negative, no sign of DVT   Assessment/Plan: POD #2. S/p abdominal myomectomy. TAP block.  Surgical findings dw pt and photographs shared.  Stable, doing well.  Discharge home. Post op care/ warning ss dw pt F/up 2 wks.    LOS: 2 days    Casey Royals, MD 10/20/2022, 8:42 AM

## 2022-12-03 ENCOUNTER — Other Ambulatory Visit: Payer: Self-pay | Admitting: Nurse Practitioner

## 2022-12-03 DIAGNOSIS — I10 Essential (primary) hypertension: Secondary | ICD-10-CM

## 2022-12-03 NOTE — Telephone Encounter (Signed)
Requested Prescriptions  Pending Prescriptions Disp Refills   amLODipine (NORVASC) 2.5 MG tablet [Pharmacy Med Name: AMLODIPINE BESYLATE 2.5 MG TAB] 90 tablet 0    Sig: TAKE 1 TABLET BY MOUTH EVERY DAY     Cardiovascular: Calcium Channel Blockers 2 Failed - 12/03/2022 10:08 AM      Failed - Last BP in normal range    BP Readings from Last 1 Encounters:  10/20/22 (!) 141/73         Passed - Last Heart Rate in normal range    Pulse Readings from Last 1 Encounters:  10/20/22 71         Passed - Valid encounter within last 6 months    Recent Outpatient Visits           3 months ago Left lower quadrant abdominal pain   [facility] Berniece Salines, FNP   8 months ago Encounter for annual physical exam   [facility] Berniece Salines, FNP   1 year ago Essential hypertension   [facility] Berniece Salines, FNP       Future Appointments             In 3 months Zane Herald, Rudolpho Sevin, FNP [facility], [facility]

## 2022-12-21 ENCOUNTER — Emergency Department: Payer: BC Managed Care – PPO

## 2022-12-21 ENCOUNTER — Other Ambulatory Visit: Payer: Self-pay

## 2022-12-21 ENCOUNTER — Ambulatory Visit: Payer: BC Managed Care – PPO

## 2022-12-21 ENCOUNTER — Emergency Department
Admission: EM | Admit: 2022-12-21 | Discharge: 2022-12-21 | Disposition: A | Discharge status: Home / Self Care | Payer: BC Managed Care – PPO | Attending: Emergency Medicine | Admitting: Emergency Medicine

## 2022-12-21 ENCOUNTER — Encounter: Payer: Self-pay | Admitting: Emergency Medicine

## 2022-12-21 DIAGNOSIS — R258 Other abnormal involuntary movements: Secondary | ICD-10-CM | POA: Insufficient documentation

## 2022-12-21 DIAGNOSIS — I10 Essential (primary) hypertension: Secondary | ICD-10-CM | POA: Insufficient documentation

## 2022-12-21 DIAGNOSIS — R569 Unspecified convulsions: Secondary | ICD-10-CM | POA: Diagnosis not present

## 2022-12-21 DIAGNOSIS — R531 Weakness: Secondary | ICD-10-CM | POA: Insufficient documentation

## 2022-12-21 LAB — URINALYSIS, ROUTINE W REFLEX MICROSCOPIC
Bilirubin Urine: NEGATIVE
Glucose, UA: NEGATIVE mg/dL
Hgb urine dipstick: NEGATIVE
Ketones, ur: 20 mg/dL — AB
Leukocytes,Ua: NEGATIVE
Nitrite: NEGATIVE
Protein, ur: NEGATIVE mg/dL
Specific Gravity, Urine: 1.019 (ref 1.005–1.030)
pH: 5 (ref 5.0–8.0)

## 2022-12-21 LAB — URINE DRUG SCREEN, QUALITATIVE (ARMC ONLY)
Amphetamines, Ur Screen: NOT DETECTED
Barbiturates, Ur Screen: NOT DETECTED
Benzodiazepine, Ur Scrn: NOT DETECTED
Cannabinoid 50 Ng, Ur ~~LOC~~: POSITIVE — AB
Cocaine Metabolite,Ur ~~LOC~~: NOT DETECTED
MDMA (Ecstasy)Ur Screen: NOT DETECTED
Methadone Scn, Ur: NOT DETECTED
Opiate, Ur Screen: NOT DETECTED
Phencyclidine (PCP) Ur S: NOT DETECTED
Tricyclic, Ur Screen: NOT DETECTED

## 2022-12-21 LAB — BASIC METABOLIC PANEL
Anion gap: 9 (ref 5–15)
BUN: 13 mg/dL (ref 6–20)
CO2: 24 mmol/L (ref 22–32)
Calcium: 9.2 mg/dL (ref 8.9–10.3)
Chloride: 102 mmol/L (ref 98–111)
Creatinine, Ser: 0.7 mg/dL (ref 0.44–1.00)
GFR, Estimated: >60 mL/min (ref >60)
Glucose, Bld: 98 mg/dL (ref 70–99)
Potassium: 4 mmol/L (ref 3.5–5.1)
Sodium: 135 mmol/L (ref 135–145)

## 2022-12-21 LAB — CBC
HCT: 41.5 % (ref 36.0–46.0)
Hemoglobin: 13 g/dL (ref 12.0–15.0)
MCH: 23.4 pg — ABNORMAL LOW (ref 26.0–34.0)
MCHC: 31.3 g/dL (ref 30.0–36.0)
MCV: 74.8 fL — ABNORMAL LOW (ref 80.0–100.0)
Platelets: 279 10*3/uL (ref 150–400)
RBC: 5.55 MIL/uL — ABNORMAL HIGH (ref 3.87–5.11)
RDW: 15.9 % — ABNORMAL HIGH (ref 11.5–15.5)
WBC: 9.5 10*3/uL (ref 4.0–10.5)
nRBC: 0 % (ref 0.0–0.2)

## 2022-12-21 LAB — T4, FREE: Free T4: 0.74 ng/dL (ref 0.61–1.12)

## 2022-12-21 LAB — POC URINE PREG, ED: Preg Test, Ur: NEGATIVE

## 2022-12-21 LAB — MAGNESIUM: Magnesium: 2.3 mg/dL (ref 1.7–2.4)

## 2022-12-21 LAB — TSH: TSH: 0.805 u[IU]/mL (ref 0.350–4.500)

## 2022-12-21 MED ORDER — LEVETIRACETAM 500 MG PO TABS: 500.0000 mg | ORAL_TABLET | Freq: Two times a day (BID) | ORAL | Status: DC

## 2022-12-21 MED ORDER — LEVETIRACETAM 500 MG PO TABS
500.0000 mg | ORAL_TABLET | Freq: Once | ORAL | Status: AC
  Administered 2022-12-21: 500 mg via ORAL
  Filled 2022-12-21: qty 1

## 2022-12-21 MED ORDER — LORAZEPAM 2 MG/ML IJ SOLN
2.0000 mg | Freq: Once | INTRAMUSCULAR | Status: AC | PRN
  Administered 2022-12-21: 2 mg via INTRAVENOUS
  Filled 2022-12-21: qty 1

## 2022-12-21 MED ORDER — GADOBUTROL 1 MMOL/ML IV SOLN
10.0000 mL | Freq: Once | INTRAVENOUS | Status: AC | PRN
  Administered 2022-12-21: 10 mL via INTRAVENOUS

## 2022-12-21 MED ORDER — LEVETIRACETAM 500 MG PO TABS: 500.0000 mg | ORAL_TABLET | Freq: Two times a day (BID) | ORAL | 1 refills | Status: DC

## 2022-12-21 NOTE — ED Notes (Signed)
This RN called MRI, states 1-1.5 hours until pt's MRI, and they will call before to medicate.

## 2022-12-21 NOTE — Discharge Instructions (Addendum)
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 Sherryll Burger. Should be given number to call for appointment [phone]    Please call the neurology team to make a follow-up appointment take the Keppra starting tomorrow to help prevent seizures and return to the ER if you develop worsening symptoms or any other concerns.   Standard seizure precautions: Per [facility] statutes, patients with seizures are not allowed to drive until  they have been seizure-free for six months. Use caution when using heavy equipment or power tools. Avoid working on ladders or at heights. Take showers instead of baths. Ensure the water temperature is not too high on the home water heater. Do not go swimming alone. When caring for infants or small children, sit down when holding, feeding, or changing them to minimize risk of injury to the child in the event you have a seizure.  To reduce risk of seizures, maintain good sleep hygiene avoid alcohol and illicit drug use, take all anti-seizure medications as prescribed.

## 2022-12-21 NOTE — ED Notes (Signed)
Pt in CT.

## 2022-12-21 NOTE — ED Notes (Signed)
Pt. To MRI

## 2022-12-21 NOTE — ED Provider Notes (Signed)
5:33 PM Assumed care for off going team.   Blood pressure (!) 115/93, pulse 81, temperature 98.5 F (36.9 C), temperature source Oral, resp. rate 18, height 5\' 4"  (1.626 m), weight 98.9 kg, SpO2 99 %, unknown if currently breastfeeding.  See their HPI for full report but in brief pending MRI   IMPRESSION: Unremarkable brain MRI.  No correlate for seizure history.    8:14 PM reevaluated patient patient reports no additional symptoms.  We discussed her positive marijuana test and to not use this or alcohol as these can all increase the risk for seizures.  We discussed the plan from neurology and starting the Keppra which she was comfortable with.  We also discussed seizure precautions and not driving which she is aware of.  I did call pharmacy to see if there was a way to get her the medications here before discharge but unfortunately she will have to get it filled outpatient.  We will give her her first dose here to.  She understands if she develops return of symptoms she can return to the ER for repeat evaluation otherwise she can follow-up outpatient with neurology but at this time she feels comfortable with discharge home and will have someone come pick her up  I discussed the provisional nature of ED diagnosis, the treatment so far, the ongoing plan of care, follow up appointments and return precautions with the patient and any family or support people present. They expressed understanding and agreed with the plan, discharged home.       Concha Se, MD 12/21/22 Elisha Ponder

## 2022-12-21 NOTE — ED Triage Notes (Signed)
Patient to ED via POV for generalized weakness and fatigue. Patient states she possibly had a seizure around 2am- was on phone with friend and friend states she had slurred speech then stopped talking but hurt grunting noises. EMS was called to home but patient refused to to come to ED. Today feeling weak and dizzy. Denies seizure history. Ambulatory to triage.

## 2022-12-21 NOTE — Consult Note (Signed)
Neurology Consultation Reason for Consult: c/f seizure Requesting Physician: Pilar Jarvis  CC: seizure like activity   History is obtained from: Patient, ED Provider, chart review   HPI: Casey Schroeder is a 32 y.o. 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  Due to her recent surgery she lost some of her study time and had been making this up by staying up to study.  On Sunday night she was on the phone, and she remembers the beginning of the conversation and next remembers waking up on the floor with EMS at the door.  She was generally weak all over after this event, not significantly confused by the time of EMS arrival, declined transport to the ED.  She felt too unwell to go to work on Monday, tried to go to work today and was feeling better but still very fatigued and therefore did decide to come to the ED for further evaluation.  She notes she had some right jaw pain that went into her ear approximately a week ago which resolved.  Otherwise no headaches or facial pain.  No focal neurological symptoms  She reports that her friend told her her speech became very slow/slurred, with delayed responses and then some nonsensical speech followed by no longer responding.  She had some rustling sounds subsequently which she had initially thought was the patient's dog jumping onto the bed.  However given the patient was not responding her friend called EMS to check on her.  She denies any other signs or symptoms of seizure-like activity except reporting that at times for the past 2 months she has had students tell her they had asked her permission to do something that she was surprised to find them doing a few minutes later.  No other episodes of speech difficulty or loss of  awareness of time or unexplained incontinence  She notes that her mom died of a prolonged seizure when the patient was 34 years old, unclear what the etiology of her seizures were as she passed away just before she was to be seen by neurology.  No other family history of seizures  Regarding seizure risk factors, seizures in her mom as documented above, no personal history of seizures, no history of meningitis/encephalitis, no prior history of significant head trauma, no issues when she was born or developmental delays  ROS: All other review of systems was negative except as noted in the HPI.   Past Medical History:  Diagnosis Date   Appendicitis, acute 05/17/2013   Heart murmur    Hypertension    Uterine mass 05/17/2013   Past Surgical History:  Procedure Laterality Date   APPENDECTOMY     LAPAROSCOPIC APPENDECTOMY N/A 05/16/2013   Procedure: APPENDECTOMY LAPAROSCOPIC uterine biopsy;  Surgeon: Romie Levee, MD;  Location: [facility] ORS;  Service: General;  Laterality: N/A;   MYOMECTOMY N/A 10/18/2022   Procedure: ABDOMINAL MYOMECTOMY;  Surgeon: Shea Evans, MD;  Location: [facility] OR;  Service: Gynecology;  Laterality: N/A;  TAP BLOCK Requests 3hrs.   Current Outpatient Medications  Medication Instructions   acetaminophen (TYLENOL) 650 mg, Oral, Every 4 hours PRN   amLODipine (NORVASC) 2.5 mg, Oral, Daily   Ascorbic Acid (VITAMIN C PO) 1 tablet, Oral, Daily   Ferrous Sulfate (IRON PO) 1 tablet, Oral, Daily  FIBER PO 1 Scoop, Oral, Daily   ibuprofen (ADVIL) 600 mg, Oral, Every 6 hours   Multiple Vitamin (MULTIVITAMIN) capsule 1 capsule, Oral, Daily   Oxycodone HCl 10 mg, Oral, Every 6 hours PRN   senna (SENOKOT) 8.6 mg, Oral, 2 times daily   simethicone (MYLICON) 80 mg, Oral, 4 times daily PRN    Family History  Problem Relation Age of Onset   Cancer Maternal Aunt        breast   Breast cancer Maternal Aunt 30  - seizures in mother  Social History:  reports that she has never  smoked. She has never used smokeless tobacco. She reports current alcohol use of about 3.0 standard drinks of alcohol per week. She reports that she does not use drugs.   Exam: Current vital signs: BP (!) 115/93   Pulse 81   Temp 98.5 F (36.9 C) (Oral)   Resp 18   Ht 5\' 4"  (1.626 m)   Wt 98.9 kg   SpO2 99%   BMI 37.42 kg/m  Vital signs in last 24 hours: Temp:  [98.1 F (36.7 C)-98.5 F (36.9 C)] 98.5 F (36.9 C) (05/28 1500) Pulse Rate:  [81-104] 81 (05/28 1715) Resp:  [15-19] 18 (05/28 1715) BP: (115-144)/(89-99) 115/93 (05/28 1700) SpO2:  [98 %-100 %] 99 % (05/28 1715) Weight:  [98.9 kg] 98.9 kg (05/28 1131)   Physical Exam  Constitutional: Appears well-developed and well-nourished.  Psych: Affect mildly anxious, stressed, but appropriate and cooperative  Eyes: No scleral injection HENT: No oropharyngeal obstruction.  MSK: no joint deformities.  Cardiovascular: Normal rate and regular rhythm. Perfusing extremities well Respiratory: Effort normal, non-labored breathing GI: Soft.  No distension. There is no tenderness.  Skin: Warm dry and intact visible skin  Neuro: Mental Status: Patient is awake, alert, oriented to person, place, month, year, and situation. Patient is able to give a clear and coherent history. No signs of aphasia or neglect Cranial Nerves: II: Visual Fields are full. Pupils are equal, round, and reactive to light.   III,IV, VI: EOMI without ptosis or diploplia.  V: Facial sensation is symmetric to temperature VII: Facial movement is symmetric.  VIII: hearing with some reduction of hearing in her right ear on tuning fork testing X: Uvula elevates symmetrically XI: Shoulder shrug is symmetric. XII: tongue is midline without atrophy or fasciculations.  Motor: Tone is normal. Bulk is normal. 5/5 strength was present in all four extremities.  Sensory: Sensation is symmetric to light touch and temperature in the arms and legs.  She does however  have a length dependent loss of temperature sensation on examination Deep Tendon Reflexes: 2+ and symmetric in the brachioradialis and patellae.  Plantars: Toes are downgoing bilaterally.  Cerebellar: FNF and HKS are intact bilaterally Gait:  Deferred   I have reviewed labs in epic and the results pertinent to this consultation are:  Basic Metabolic Panel: Recent Labs  Lab 12/21/22 1155  NA 135  K 4.0  CL 102  CO2 24  GLUCOSE 98  BUN 13  CREATININE 0.70  CALCIUM 9.2  MG 2.3    CBC: Recent Labs  Lab 12/21/22 1155  WBC 9.5  HGB 13.0  HCT 41.5  MCV 74.8*  PLT 279    Coagulation Studies: No results for input(s): "LABPROT", "INR" in the last 72 hours.    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  Due to high recurrence risk with any focal seizure, despite no clearly described prior seizure management, she merits treatment with antiseizure medication.  Would also obtain MRI brain with and without contrast to more sensitively exclude any underlying structural etiology.  It is reasonable to complete routine EEG on an outpatient basis.  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 Sherryll Burger.   Should be given number to call for appointment [phone] -Appreciate ED assistance with placement of referral to [facility] clinic  and Keppra on discharge; please include seizure precautions in discharge instructions   Standard seizure precautions: Per [facility] statutes, patients with seizures are not allowed to drive until  they have been seizure-free for six months. Use caution when using heavy equipment or power tools. Avoid working on ladders or at heights. Take showers instead of baths. Ensure the water temperature is not too high on the home water heater. Do not go swimming alone. When caring for infants or small children, sit down when holding, feeding, or changing them to minimize risk of injury to the child in the event you have a seizure.  To reduce risk of seizures, maintain good sleep hygiene avoid alcohol and illicit drug use, take all anti-seizure medications as prescribed.   Brooke Dare MD-PhD [facility] [phone] [facility] coverage for [facility] is from 8 AM to 4 AM in-house and 4 PM to 8 PM by telephone/video. 8 PM to 8 AM emergent questions or overnight urgent questions should be addressed to Teleneurology On-call or [facility] neurohospitalist; contact information can be found on AMION

## 2022-12-21 NOTE — ED Notes (Signed)
This RN called lab about UDS add on, states will run it.

## 2022-12-21 NOTE — ED Notes (Signed)
Pt. Up to bathroom to obtain urine specimen.

## 2022-12-21 NOTE — ED Provider Notes (Addendum)
[facility] Provider Note    Event Date/Time   First MD Initiated Contact with Patient 12/21/22 1332     (approximate)   History   Weakness   HPI  Casey Schroeder is a 32 y.o. female   Past medical history of hypertension and myomectomy for fibroid uterus who presents with questionable first-time seizure.  This happened on Sunday evening going into Monday morning when she was on the phone with a friend when she started speaking incoherently and then fell silage.  The friend reports that there was a rustling sound the The patient remembers was firefighters knocking on her door and she was on the bedroom floor next to her bed.  The next day she felt groggy and tired.  She denies any history of seizure-like activity but had a mother who died in her 30s due to a prolonged seizure and had epilepsy diagnosis.  She has otherwise been in her regular state of health no recent illnesses and denies drug or alcohol use.  She had 1 glass of wine over the weekend.   External Medical Documents Reviewed: Discharge summary from March 2024 after myomectomy for fibroid      Physical Exam   Triage Vital Signs: ED Triage Vitals  Enc Vitals Group     BP 12/21/22 1133 (!) 144/99     Pulse Rate 12/21/22 1133 81     Resp 12/21/22 1133 18     Temp 12/21/22 1133 98.1 F (36.7 C)     Temp src --      SpO2 12/21/22 1133 99 %     Weight 12/21/22 1131 218 lb (98.9 kg)     Height 12/21/22 1131 5\' 4"  (1.626 m)     Head Circumference --      Peak Flow --      Pain Score 12/21/22 1131 0     Pain Loc --      Pain Edu? --      Excl. in GC? --     Most recent vital signs: Vitals:   12/21/22 1133  BP: (!) 144/99  Pulse: 81  Resp: 18  Temp: 98.1 F (36.7 C)  SpO2: 99%    General: Awake, no distress.  CV:  Good peripheral perfusion.  Resp:  Normal effort.  Abd:  No distention.  Other:  Comfortable.  No facial asymmetry or motor or sensory deficits.  Soft  nontender abdomen clear lungs.  Appears euvolemic.  Awake alert and oriented.  Normal vital signs.  Slightly hypertensive.   ED Results / Procedures / Treatments   Labs (all labs ordered are listed, but only abnormal results are displayed) Labs Reviewed  CBC - Abnormal; Notable for the following components:      Result Value   RBC 5.55 (*)    MCV 74.8 (*)    MCH 23.4 (*)    RDW 15.9 (*)    All other components within normal limits  URINALYSIS, ROUTINE W REFLEX MICROSCOPIC - Abnormal; Notable for the following components:   Color, Urine YELLOW (*)    APPearance HAZY (*)    Ketones, ur 20 (*)    All other components within normal limits  BASIC METABOLIC PANEL  MAGNESIUM  TSH  T4, FREE  URINE DRUG SCREEN, QUALITATIVE ([facility] ONLY)  CBG MONITORING, ED  POC URINE PREG, ED     I ordered and reviewed the above labs they are notable for normal electrolytes and cell counts  EKG  ED  ECG REPORT I, Pilar Jarvis, the attending physician, personally viewed and interpreted this ECG.   Date: 12/21/2022  EKG Time: 1131  Rate: 89  Rhythm: NSR  Axis: nl  Intervals:none  ST&T Change: no stemi    RADIOLOGY I independently reviewed and interpreted the scan of the head see no obvious bleeding or shift   PROCEDURES:  Critical Care performed: No  Procedures   MEDICATIONS ORDERED IN ED: Medications - No data to display  External physician / consultants:  I spoke with neurology consultant Dr.Bhagat Regarding care plan for this patient.   IMPRESSION / MDM / ASSESSMENT AND PLAN / ED COURSE  I reviewed the triage vital signs and the nursing notes.                                Patient's presentation is most consistent with acute presentation with potential threat to life or bodily function.  Differential diagnosis includes, but is not limited to, seizure, dysrhythmia, syncope, electrolyte derangement, intracranial bleeding, infection, pregnancy related     MDM: Sounds  like this may have been a first-time seizure in this patient who now otherwise feels well aside from some fatigue.  No focal neurologic deficits disease or stroke.  Obtain basic labs, electrolytes, thyroid studies, CT of the head and neurology consult given her significant family history of seizure disorder leading to the death of her mother.  Dr. Iver Nestle got recommends MRI with and without contrast and urine drug screen and will guide [place] with further recommendations pending the studies.       FINAL CLINICAL IMPRESSION(S) / ED DIAGNOSES   Final diagnoses:  Seizure-like activity (HCC)     Rx / DC Orders   ED Discharge Orders     None        Note:  This document was prepared using Dragon voice recognition software and may include unintentional dictation errors.    Pilar Jarvis, MD 12/21/22 1513    Pilar Jarvis, MD 12/21/22 [phone]

## 2023-01-24 NOTE — Progress Notes (Unsigned)
There were no vitals taken for this visit.   Subjective:    Patient ID: Casey Schroeder, female    DOB: Jul 04, 1991, 32 y.o.   MRN: [id number]  HPI: Casey Schroeder is a 32 y.o. female  No chief complaint on file.   Relevant past medical, surgical, family and social history reviewed and updated as indicated. Interim medical history since our last visit reviewed. Allergies and medications reviewed and updated.  Review of Systems Constitutional: Negative for fever or weight change.  Respiratory: Negative for cough and shortness of breath.   Cardiovascular: Negative for chest pain or palpitations.  Gastrointestinal: Negative for abdominal pain, no bowel changes.  Musculoskeletal: Negative for gait problem or joint swelling.  Skin: Negative for rash.  Neurological: Negative for dizziness or headache.  No other specific complaints in a complete review of systems (except as listed in HPI above).      Objective:    There were no vitals taken for this visit.  Wt Readings from Last 3 Encounters:  12/21/22 218 lb (98.9 kg)  10/18/22 225 lb (102.1 kg)  10/12/22 223 lb (101.2 kg)    Physical Exam  Constitutional: Patient appears well-developed and well-nourished. Obese *** No distress.  HEENT: head atraumatic, normocephalic, pupils equal and reactive to light, ears ***, neck supple, throat within normal limits Cardiovascular: Normal rate, regular rhythm and normal heart sounds.  No murmur heard. No BLE edema. Pulmonary/Chest: Effort normal and breath sounds normal. No respiratory distress. Abdominal: Soft.  There is no tenderness. Psychiatric: Patient has a normal mood and affect. behavior is normal. Judgment and thought content normal.  Results for orders placed or performed during the hospital encounter of 12/21/22  Basic metabolic panel  Result Value Ref Range   Sodium 135 135 - 145 mmol/L   Potassium 4.0 3.5 - 5.1 mmol/L   Chloride 102 98 - 111 mmol/L   CO2 24 22 - 32  mmol/L   Glucose, Bld 98 70 - 99 mg/dL   BUN 13 6 - 20 mg/dL   Creatinine, Ser 1.19 0.44 - 1.00 mg/dL   Calcium 9.2 8.9 - 14.7 mg/dL   GFR, Estimated >82 >95 mL/min   Anion gap 9 5 - 15  CBC  Result Value Ref Range   WBC 9.5 4.0 - 10.5 K/uL   RBC 5.55 (H) 3.87 - 5.11 MIL/uL   Hemoglobin 13.0 12.0 - 15.0 g/dL   HCT 62.1 30.8 - 65.7 %   MCV 74.8 (L) 80.0 - 100.0 fL   MCH 23.4 (L) 26.0 - 34.0 pg   MCHC 31.3 30.0 - 36.0 g/dL   RDW 84.6 (H) 96.2 - 95.2 %   Platelets 279 150 - 400 K/uL   nRBC 0.0 0.0 - 0.2 %  Urinalysis, Routine w reflex microscopic -Urine, Clean Catch  Result Value Ref Range   Color, Urine YELLOW (A) YELLOW   APPearance HAZY (A) CLEAR   Specific Gravity, Urine 1.019 1.005 - 1.030   pH 5.0 5.0 - 8.0   Glucose, UA NEGATIVE NEGATIVE mg/dL   Hgb urine dipstick NEGATIVE NEGATIVE   Bilirubin Urine NEGATIVE NEGATIVE   Ketones, ur 20 (A) NEGATIVE mg/dL   Protein, ur NEGATIVE NEGATIVE mg/dL   Nitrite NEGATIVE NEGATIVE   Leukocytes,Ua NEGATIVE NEGATIVE  Magnesium  Result Value Ref Range   Magnesium 2.3 1.7 - 2.4 mg/dL  TSH  Result Value Ref Range   TSH 0.805 0.350 - 4.500 uIU/mL  T4, free  Result Value Ref Range  Free T4 0.74 0.61 - 1.12 ng/dL  Urine Drug Screen, Qualitative  Result Value Ref Range   Tricyclic, Ur Screen NONE DETECTED NONE DETECTED   Amphetamines, Ur Screen NONE DETECTED NONE DETECTED   MDMA (Ecstasy)Ur Screen NONE DETECTED NONE DETECTED   Cocaine Metabolite,Ur [place] NONE DETECTED NONE DETECTED   Opiate, Ur Screen NONE DETECTED NONE DETECTED   Phencyclidine (PCP) Ur S NONE DETECTED NONE DETECTED   Cannabinoid 50 Ng, Ur [place] POSITIVE (A) NONE DETECTED   Barbiturates, Ur Screen NONE DETECTED NONE DETECTED   Benzodiazepine, Ur Scrn NONE DETECTED NONE DETECTED   Methadone Scn, Ur NONE DETECTED NONE DETECTED  POC urine preg, ED  Result Value Ref Range   Preg Test, Ur Negative Negative      Assessment & Plan:   Problem List Items Addressed This  Visit   None    Follow up plan: No follow-ups on file.

## 2023-01-25 ENCOUNTER — Ambulatory Visit: Payer: BC Managed Care – PPO | Admitting: Nurse Practitioner

## 2023-01-25 ENCOUNTER — Encounter: Payer: Self-pay | Admitting: Nurse Practitioner

## 2023-01-25 ENCOUNTER — Other Ambulatory Visit: Payer: Self-pay

## 2023-01-25 VITALS — BP 120/72 | HR 90 | Temp 98.2°F | Resp 16 | Ht 64.0 in | Wt 224.2 lb

## 2023-01-25 DIAGNOSIS — F5105 Insomnia due to other mental disorder: Secondary | ICD-10-CM | POA: Diagnosis not present

## 2023-01-25 DIAGNOSIS — F330 Major depressive disorder, recurrent, mild: Secondary | ICD-10-CM

## 2023-01-25 DIAGNOSIS — F99 Mental disorder, not otherwise specified: Secondary | ICD-10-CM

## 2023-01-25 DIAGNOSIS — F419 Anxiety disorder, unspecified: Secondary | ICD-10-CM

## 2023-01-25 MED ORDER — ESCITALOPRAM OXALATE 10 MG PO TABS
10.0000 mg | ORAL_TABLET | Freq: Every day | ORAL | 0 refills | Status: DC
Start: 2023-01-25 — End: 2023-02-07

## 2023-01-25 MED ORDER — HYDROXYZINE PAMOATE 25 MG PO CAPS
25.0000 mg | ORAL_CAPSULE | Freq: Every evening | ORAL | 0 refills | Status: DC | PRN
Start: 2023-01-25 — End: 2023-02-24

## 2023-01-25 NOTE — Assessment & Plan Note (Signed)
Start lexapro 10 mg daily, can use hydroxyzine at bedtime.  Continue appointments with therapist. Follow up in 4 weeks.  

## 2023-01-25 NOTE — Assessment & Plan Note (Signed)
Start lexapro 10 mg daily, can use hydroxyzine at bedtime.  Continue appointments with therapist. Follow up in 4 weeks.

## 2023-02-06 ENCOUNTER — Encounter: Payer: Self-pay | Admitting: Nurse Practitioner

## 2023-02-07 ENCOUNTER — Other Ambulatory Visit: Payer: Self-pay | Admitting: Nurse Practitioner

## 2023-02-07 DIAGNOSIS — F330 Major depressive disorder, recurrent, mild: Secondary | ICD-10-CM

## 2023-02-07 DIAGNOSIS — F419 Anxiety disorder, unspecified: Secondary | ICD-10-CM

## 2023-02-07 MED ORDER — SERTRALINE HCL 25 MG PO TABS
25.0000 mg | ORAL_TABLET | Freq: Every day | ORAL | 0 refills | Status: DC
Start: 2023-02-07 — End: 2023-02-24

## 2023-02-23 NOTE — Progress Notes (Unsigned)
LMP 01/16/2023    Subjective:    Patient ID: Casey Schroeder, female    DOB: May 26, 1991, 31 y.o.   MRN: [id number]  HPI: Casey Schroeder is a 32 y.o. female  No chief complaint on file.  Depression/anxiety Medication started lexapro 10 mg daily and hydroxyzine as needed at bed time. Patient here for 4 week follow up. Patient reports *** Compliant *** Side effects *** PHQ9 *** GAD *** Therapy goes to therapy once a week       01/25/2023    7:38 AM 08/25/2022    1:15 PM 03/09/2022    2:47 PM 11/02/2021   10:32 AM  Depression screen PHQ 2/9  Decreased Interest 1 0 0 0  Down, Depressed, Hopeless 2 0 0 0  PHQ - 2 Score 3 0 0 0  Altered sleeping 2  0   Tired, decreased energy 3  0   Change in appetite 2  0   Feeling bad or failure about yourself  2  0   Trouble concentrating 1  0   Moving slowly or fidgety/restless 1  0   Suicidal thoughts 0  0   PHQ-9 Score 14  0   Difficult doing work/chores Somewhat difficult  Not difficult at all        01/25/2023    7:38 AM  GAD 7 : Generalized Anxiety Score  Nervous, Anxious, on Edge 2  Control/stop worrying 3  Worry too much - different things 3  Trouble relaxing 1  Restless 2  Easily annoyed or irritable 1  Afraid - awful might happen 1  Total GAD 7 Score 13  Anxiety Difficulty Somewhat difficult     Relevant past medical, surgical, family and social history reviewed and updated as indicated. Interim medical history since our last visit reviewed. Allergies and medications reviewed and updated.  Review of Systems Constitutional: Negative for fever or weight change.  Respiratory: Negative for cough and shortness of breath.   Cardiovascular: Negative for chest pain or palpitations.  Gastrointestinal: Negative for abdominal pain, no bowel changes.  Musculoskeletal: Negative for gait problem or joint swelling.  Skin: Negative for rash.  Neurological: Negative for dizziness or headache.  No other specific complaints in  a complete review of systems (except as listed in HPI above).      Objective:    LMP 01/16/2023   Wt Readings from Last 3 Encounters:  01/25/23 224 lb 3.2 oz (101.7 kg)  12/21/22 218 lb (98.9 kg)  10/18/22 225 lb (102.1 kg)    Physical Exam  Constitutional: Patient appears well-developed and well-nourished. Obese   No distress.  HEENT: head atraumatic, normocephalic, pupils equal and reactive to light, neck supple, throat within normal limits Cardiovascular: Normal rate, regular rhythm and normal heart sounds.  No murmur heard. No BLE edema. Pulmonary/Chest: Effort normal and breath sounds normal. No respiratory distress. Abdominal: Soft.  There is no tenderness. Psychiatric: Patient has a normal mood and affect. behavior is normal. Judgment and thought content normal.   Results for orders placed or performed during the hospital encounter of 12/21/22  Basic metabolic panel  Result Value Ref Range   Sodium 135 135 - 145 mmol/L   Potassium 4.0 3.5 - 5.1 mmol/L   Chloride 102 98 - 111 mmol/L   CO2 24 22 - 32 mmol/L   Glucose, Bld 98 70 - 99 mg/dL   BUN 13 6 - 20 mg/dL   Creatinine, Ser 7.84 0.44 - 1.00 mg/dL  Calcium 9.2 8.9 - 10.3 mg/dL   GFR, Estimated >10 >27 mL/min   Anion gap 9 5 - 15  CBC  Result Value Ref Range   WBC 9.5 4.0 - 10.5 K/uL   RBC 5.55 (H) 3.87 - 5.11 MIL/uL   Hemoglobin 13.0 12.0 - 15.0 g/dL   HCT 25.3 66.4 - 40.3 %   MCV 74.8 (L) 80.0 - 100.0 fL   MCH 23.4 (L) 26.0 - 34.0 pg   MCHC 31.3 30.0 - 36.0 g/dL   RDW 47.4 (H) 25.9 - 56.3 %   Platelets 279 150 - 400 K/uL   nRBC 0.0 0.0 - 0.2 %  Urinalysis, Routine w reflex microscopic -Urine, Clean Catch  Result Value Ref Range   Color, Urine YELLOW (A) YELLOW   APPearance HAZY (A) CLEAR   Specific Gravity, Urine 1.019 1.005 - 1.030   pH 5.0 5.0 - 8.0   Glucose, UA NEGATIVE NEGATIVE mg/dL   Hgb urine dipstick NEGATIVE NEGATIVE   Bilirubin Urine NEGATIVE NEGATIVE   Ketones, ur 20 (A) NEGATIVE mg/dL    Protein, ur NEGATIVE NEGATIVE mg/dL   Nitrite NEGATIVE NEGATIVE   Leukocytes,Ua NEGATIVE NEGATIVE  Magnesium  Result Value Ref Range   Magnesium 2.3 1.7 - 2.4 mg/dL  TSH  Result Value Ref Range   TSH 0.805 0.350 - 4.500 uIU/mL  T4, free  Result Value Ref Range   Free T4 0.74 0.61 - 1.12 ng/dL  Urine Drug Screen, Qualitative  Result Value Ref Range   Tricyclic, Ur Screen NONE DETECTED NONE DETECTED   Amphetamines, Ur Screen NONE DETECTED NONE DETECTED   MDMA (Ecstasy)Ur Screen NONE DETECTED NONE DETECTED   Cocaine Metabolite,Ur [place] NONE DETECTED NONE DETECTED   Opiate, Ur Screen NONE DETECTED NONE DETECTED   Phencyclidine (PCP) Ur S NONE DETECTED NONE DETECTED   Cannabinoid 50 Ng, Ur [place] POSITIVE (A) NONE DETECTED   Barbiturates, Ur Screen NONE DETECTED NONE DETECTED   Benzodiazepine, Ur Scrn NONE DETECTED NONE DETECTED   Methadone Scn, Ur NONE DETECTED NONE DETECTED  POC urine preg, ED  Result Value Ref Range   Preg Test, Ur Negative Negative      Assessment & Plan:   Problem List Items Addressed This Visit   None     Follow up plan: No follow-ups on file.

## 2023-02-24 ENCOUNTER — Other Ambulatory Visit: Payer: Self-pay | Admitting: Nurse Practitioner

## 2023-02-24 ENCOUNTER — Ambulatory Visit: Payer: BC Managed Care – PPO | Admitting: Nurse Practitioner

## 2023-02-24 ENCOUNTER — Other Ambulatory Visit: Payer: Self-pay

## 2023-02-24 ENCOUNTER — Encounter: Payer: Self-pay | Admitting: Nurse Practitioner

## 2023-02-24 VITALS — BP 126/72 | HR 98 | Temp 98.3°F | Resp 16 | Ht 64.0 in | Wt 226.9 lb

## 2023-02-24 DIAGNOSIS — F419 Anxiety disorder, unspecified: Secondary | ICD-10-CM

## 2023-02-24 DIAGNOSIS — F330 Major depressive disorder, recurrent, mild: Secondary | ICD-10-CM | POA: Diagnosis not present

## 2023-02-24 DIAGNOSIS — F5105 Insomnia due to other mental disorder: Secondary | ICD-10-CM

## 2023-02-24 MED ORDER — SERTRALINE HCL 25 MG PO TABS: 25.0000 mg | ORAL_TABLET | Freq: Every day | ORAL | 0 refills | Status: DC

## 2023-02-24 MED ORDER — BUPROPION HCL ER (XL) 150 MG PO TB24
150.0000 mg | ORAL_TABLET | Freq: Every day | ORAL | 0 refills | Status: DC
Start: 2023-02-24 — End: 2023-03-29

## 2023-02-24 NOTE — Telephone Encounter (Signed)
Requested Prescriptions  Pending Prescriptions Disp Refills   hydrOXYzine (VISTARIL) 25 MG capsule [Pharmacy Med Name: HYDROXYZINE PAM 25 MG CAP] 90 capsule 0    Sig: TAKE 1 CAPSULE (25 MG TOTAL) BY MOUTH AT BEDTIME AS NEEDED FOR ANXIETY.     Ear, Nose, and Throat:  Antihistamines 2 Passed - 02/24/2023  2:42 AM      Passed - Cr in normal range and within 360 days    Creat  Date Value Ref Range Status  08/25/2022 0.71 0.50 - 0.97 mg/dL Final   Creatinine, Ser  Date Value Ref Range Status  12/21/2022 0.70 0.44 - 1.00 mg/dL Final         Passed - Valid encounter within last 12 months    Recent Outpatient Visits           Today Mild episode of recurrent major depressive disorder [facility])   [facility] Della Goo F, FNP   1 month ago Mild episode of recurrent major depressive disorder [facility])   [facility] Della Goo F, FNP   6 months ago Left lower quadrant abdominal pain   [facility] Berniece Salines, FNP   11 months ago Encounter for annual physical exam   [facility] Berniece Salines, FNP   1 year ago Essential hypertension   [facility] Berniece Salines, FNP       Future Appointments             In 2 weeks Zane Herald, Rudolpho Sevin, FNP [facility], [facility]

## 2023-02-24 NOTE — Assessment & Plan Note (Signed)
Continue zoloft 25 mg daily, add on wellbutrin 150 mg daily follow up in 4 weeks

## 2023-03-01 ENCOUNTER — Encounter: Payer: Self-pay | Admitting: Nurse Practitioner

## 2023-03-01 ENCOUNTER — Other Ambulatory Visit: Payer: Self-pay | Admitting: Nurse Practitioner

## 2023-03-01 DIAGNOSIS — F330 Major depressive disorder, recurrent, mild: Secondary | ICD-10-CM

## 2023-03-01 DIAGNOSIS — F419 Anxiety disorder, unspecified: Secondary | ICD-10-CM

## 2023-03-14 ENCOUNTER — Encounter: Payer: BC Managed Care – PPO | Admitting: Nurse Practitioner

## 2023-03-26 ENCOUNTER — Other Ambulatory Visit: Payer: Self-pay | Admitting: Nurse Practitioner

## 2023-03-26 DIAGNOSIS — F330 Major depressive disorder, recurrent, mild: Secondary | ICD-10-CM

## 2023-03-26 DIAGNOSIS — F419 Anxiety disorder, unspecified: Secondary | ICD-10-CM

## 2023-03-29 NOTE — Telephone Encounter (Signed)
Requested Prescriptions  Pending Prescriptions Disp Refills   buPROPion (WELLBUTRIN XL) 150 MG 24 hr tablet [Pharmacy Med Name: BUPROPION HCL XL 150 MG TABLET] 30 tablet 0    Sig: TAKE 1 TABLET BY MOUTH EVERY DAY     Psychiatry: Antidepressants - bupropion Passed - 03/26/2023  8:29 AM      Passed - Cr in normal range and within 360 days    Creat  Date Value Ref Range Status  08/25/2022 0.71 0.50 - 0.97 mg/dL Final   Creatinine, Ser  Date Value Ref Range Status  12/21/2022 0.70 0.44 - 1.00 mg/dL Final         Passed - AST in normal range and within 360 days    AST  Date Value Ref Range Status  08/25/2022 17 10 - 30 U/L Final         Passed - ALT in normal range and within 360 days    ALT  Date Value Ref Range Status  08/25/2022 14 6 - 29 U/L Final         Passed - Completed PHQ-2 or PHQ-9 in the last 360 days      Passed - Last BP in normal range    BP Readings from Last 1 Encounters:  02/24/23 126/72         Passed - Valid encounter within last 6 months    Recent Outpatient Visits           1 month ago Mild episode of recurrent major depressive disorder [facility])   [facility] Berniece Salines, FNP   2 months ago Mild episode of recurrent major depressive disorder [facility])   [facility] Berniece Salines, FNP   7 months ago Left lower quadrant abdominal pain   [facility] Berniece Salines, FNP   1 year ago Encounter for annual physical exam   [facility] Berniece Salines, FNP   1 year ago Essential hypertension   [facility] Berniece Salines, [place]

## 2023-04-22 ENCOUNTER — Other Ambulatory Visit: Payer: Self-pay | Admitting: Nurse Practitioner

## 2023-04-22 DIAGNOSIS — I10 Essential (primary) hypertension: Secondary | ICD-10-CM

## 2023-04-22 NOTE — Telephone Encounter (Signed)
Requested Prescriptions  Pending Prescriptions Disp Refills   amLODipine (NORVASC) 2.5 MG tablet [Pharmacy Med Name: AMLODIPINE BESYLATE 2.5 MG TAB] 90 tablet 1    Sig: TAKE 1 TABLET BY MOUTH EVERY DAY     Cardiovascular: Calcium Channel Blockers 2 Passed - 04/22/2023 11:40 AM      Passed - Last BP in normal range    BP Readings from Last 1 Encounters:  02/24/23 126/72         Passed - Last Heart Rate in normal range    Pulse Readings from Last 1 Encounters:  02/24/23 98         Passed - Valid encounter within last 6 months    Recent Outpatient Visits           1 month ago Mild episode of recurrent major depressive disorder [facility])   [facility] Della Goo F, FNP   2 months ago Mild episode of recurrent major depressive disorder [facility])   [facility] Berniece Salines, FNP   8 months ago Left lower quadrant abdominal pain   [facility] Berniece Salines, FNP   1 year ago Encounter for annual physical exam   [facility] Berniece Salines, FNP   1 year ago Essential hypertension   [facility] Berniece Salines, [place]

## 2023-05-02 ENCOUNTER — Other Ambulatory Visit: Payer: Self-pay | Admitting: Nurse Practitioner

## 2023-05-02 DIAGNOSIS — F419 Anxiety disorder, unspecified: Secondary | ICD-10-CM

## 2023-05-02 DIAGNOSIS — F330 Major depressive disorder, recurrent, mild: Secondary | ICD-10-CM

## 2023-05-03 NOTE — Telephone Encounter (Signed)
Requested Prescriptions  Pending Prescriptions Disp Refills   buPROPion (WELLBUTRIN XL) 150 MG 24 hr tablet [Pharmacy Med Name: BUPROPION HCL XL 150 MG TABLET] 90 tablet 0    Sig: TAKE 1 TABLET BY MOUTH EVERY DAY     Psychiatry: Antidepressants - bupropion Passed - 05/02/2023  1:14 AM      Passed - Cr in normal range and within 360 days    Creat  Date Value Ref Range Status  08/25/2022 0.71 0.50 - 0.97 mg/dL Final   Creatinine, Ser  Date Value Ref Range Status  12/21/2022 0.70 0.44 - 1.00 mg/dL Final         Passed - AST in normal range and within 360 days    AST  Date Value Ref Range Status  08/25/2022 17 10 - 30 U/L Final         Passed - ALT in normal range and within 360 days    ALT  Date Value Ref Range Status  08/25/2022 14 6 - 29 U/L Final         Passed - Completed PHQ-2 or PHQ-9 in the last 360 days      Passed - Last BP in normal range    BP Readings from Last 1 Encounters:  02/24/23 126/72         Passed - Valid encounter within last 6 months    Recent Outpatient Visits           2 months ago Mild episode of recurrent major depressive disorder [facility])   [facility] Berniece Salines, FNP   3 months ago Mild episode of recurrent major depressive disorder [facility])   [facility] Della Goo F, FNP   8 months ago Left lower quadrant abdominal pain   [facility] Berniece Salines, FNP   1 year ago Encounter for annual physical exam   [facility] Berniece Salines, FNP   1 year ago Essential hypertension   [facility] Berniece Salines, FNP       Future Appointments             In 6 days Zane Herald, Rudolpho Sevin, FNP [facility], [facility]

## 2023-05-09 ENCOUNTER — Ambulatory Visit: Payer: BC Managed Care – PPO | Attending: Nurse Practitioner

## 2023-05-09 ENCOUNTER — Encounter: Payer: Self-pay | Admitting: Nurse Practitioner

## 2023-05-09 ENCOUNTER — Other Ambulatory Visit: Payer: Self-pay

## 2023-05-09 ENCOUNTER — Ambulatory Visit: Payer: BC Managed Care – PPO | Admitting: Nurse Practitioner

## 2023-05-09 VITALS — BP 122/80 | HR 116 | Temp 98.1°F | Resp 16 | Ht 64.0 in | Wt 214.6 lb

## 2023-05-09 DIAGNOSIS — R002 Palpitations: Secondary | ICD-10-CM | POA: Diagnosis not present

## 2023-05-09 DIAGNOSIS — R000 Tachycardia, unspecified: Secondary | ICD-10-CM | POA: Diagnosis not present

## 2023-05-09 NOTE — Progress Notes (Signed)
BP 122/80   Pulse (!) 116   Temp 98.1 F (36.7 C) (Oral)   Resp 16   Ht 5\' 4"  (1.626 m)   Wt 214 lb 9.6 oz (97.3 kg)   LMP 04/25/2023   SpO2 98%   BMI 36.84 kg/m    Subjective:    Patient ID: Casey Schroeder, female    DOB: 1991/02/20, 32 y.o.   MRN: [id number]  HPI: [name] is a 32 y.o. female  Chief Complaint  Patient presents with   Referral    Cardiology. Heart rate fluctuating   Palpitations/tachycardia:  patient reports that her watch has been telling her that her heart rate is high. She says that her highest reading was 149  at rest.   Today in the office her heart rate was 116.  Patient denies any chest pain or shortness of breath. She does report palpitations. She reports some fatigue with activity. Patient would like referral to cardiology. Will get EKG, labs, zip patch and place referral.  EKG: sinus tachycardia.  Relevant past medical, surgical, family and social history reviewed and updated as indicated. Interim medical history since our last visit reviewed. Allergies and medications reviewed and updated.  Review of Systems Constitutional: Negative for fever or weight change.  Respiratory: Negative for cough and shortness of breath.   Cardiovascular: Negative for chest pain, positive for  palpitations.  Gastrointestinal: Negative for abdominal pain, no bowel changes.  Musculoskeletal: Negative for gait problem or joint swelling.  Skin: Negative for rash.  Neurological: Negative for dizziness or headache.  No other specific complaints in a complete review of systems (except as listed in HPI above).      Objective:    BP 122/80   Pulse (!) 116   Temp 98.1 F (36.7 C) (Oral)   Resp 16   Ht 5\' 4"  (1.626 m)   Wt 214 lb 9.6 oz (97.3 kg)   LMP 04/25/2023   SpO2 98%   BMI 36.84 kg/m   Wt Readings from Last 3 Encounters:  05/09/23 214 lb 9.6 oz (97.3 kg)  02/24/23 226 lb 14.4 oz (102.9 kg)  01/25/23 224 lb 3.2 oz (101.7 kg)    Physical  Exam  Constitutional: Patient appears well-developed and well-nourished. Obese  No distress.  HEENT: head atraumatic, normocephalic, pupils equal and reactive to light, neck supple,  Cardiovascular: tachycardic rate, regular rhythm and normal heart sounds.  No murmur heard. No BLE edema. Pulmonary/Chest: Effort normal and breath sounds normal. No respiratory distress. Abdominal: Soft.  There is no tenderness. Psychiatric: Patient has a normal mood and affect. behavior is normal. Judgment and thought content normal.      Assessment & Plan:   Problem List Items Addressed This Visit   None Visit Diagnoses     Palpitations    -  Primary   ekg, labs, zip patch, referral to cardiology   Relevant Orders   CBC with Differential/Platelet   COMPLETE METABOLIC PANEL WITH GFR   TSH   EKG 12-Lead   LONG TERM MONITOR (3-14 DAYS)   Ambulatory referral to Cardiology   Tachycardia       ekg, labs, zip patch, referral to cardiology   Relevant Orders   CBC with Differential/Platelet   COMPLETE METABOLIC PANEL WITH GFR   TSH   EKG 12-Lead   LONG TERM MONITOR (3-14 DAYS)   Ambulatory referral to Cardiology        Follow up plan: Return if symptoms worsen or fail  to improve.

## 2023-05-10 ENCOUNTER — Other Ambulatory Visit: Payer: Self-pay | Admitting: Nurse Practitioner

## 2023-05-10 DIAGNOSIS — R002 Palpitations: Secondary | ICD-10-CM

## 2023-05-11 ENCOUNTER — Other Ambulatory Visit: Payer: Self-pay | Admitting: Nurse Practitioner

## 2023-05-11 ENCOUNTER — Encounter: Payer: Self-pay | Admitting: Nurse Practitioner

## 2023-05-11 DIAGNOSIS — D751 Secondary polycythemia: Secondary | ICD-10-CM

## 2023-05-11 DIAGNOSIS — D75839 Thrombocytosis, unspecified: Secondary | ICD-10-CM

## 2023-05-11 LAB — COMPLETE METABOLIC PANEL WITH GFR
AG Ratio: 1.4 (calc) (ref 1.0–2.5)
ALT: 21 U/L (ref 6–29)
AST: 22 U/L (ref 10–30)
Albumin: 4.8 g/dL (ref 3.6–5.1)
Alkaline phosphatase (APISO): 67 U/L (ref 31–125)
BUN: 8 mg/dL (ref 7–25)
CO2: 23 mmol/L (ref 20–32)
Calcium: 10.1 mg/dL (ref 8.6–10.2)
Chloride: 102 mmol/L (ref 98–110)
Creat: 0.86 mg/dL (ref 0.50–0.97)
Globulin: 3.4 g/dL (ref 1.9–3.7)
Glucose, Bld: 83 mg/dL (ref 65–99)
Potassium: 4.4 mmol/L (ref 3.5–5.3)
Sodium: 137 mmol/L (ref 135–146)
Total Bilirubin: 0.5 mg/dL (ref 0.2–1.2)
Total Protein: 8.2 g/dL — ABNORMAL HIGH (ref 6.1–8.1)
eGFR: 91 mL/min/{1.73_m2} (ref >=60)

## 2023-05-11 LAB — IRON,TIBC AND FERRITIN PANEL
%SAT: 19 % (ref 16–45)
Ferritin: 22 ng/mL (ref 16–154)
Iron: 89 ug/dL (ref 40–190)
TIBC: 477 ug/dL — ABNORMAL HIGH (ref 250–450)

## 2023-05-11 LAB — CBC WITH DIFFERENTIAL/PLATELET
Absolute Monocytes: 590 {cells}/uL (ref 200–950)
Basophils Absolute: 58 {cells}/uL (ref 0–200)
Basophils Relative: 0.8 %
Eosinophils Absolute: 158 {cells}/uL (ref 15–500)
Eosinophils Relative: 2.2 %
HCT: 48.7 % — ABNORMAL HIGH (ref 35.0–45.0)
Hemoglobin: 14.7 g/dL (ref 11.7–15.5)
Lymphs Abs: 1915 {cells}/uL (ref 850–3900)
MCH: 22.9 pg — ABNORMAL LOW (ref 27.0–33.0)
MCHC: 30.2 g/dL — ABNORMAL LOW (ref 32.0–36.0)
MCV: 75.9 fL — ABNORMAL LOW (ref 80.0–100.0)
MPV: 13.3 fL — ABNORMAL HIGH (ref 7.5–12.5)
Monocytes Relative: 8.2 %
Neutro Abs: 4478 {cells}/uL (ref 1500–7800)
Neutrophils Relative %: 62.2 %
Platelets: 418 10*3/uL — ABNORMAL HIGH (ref 140–400)
RBC: 6.42 10*6/uL — ABNORMAL HIGH (ref 3.80–5.10)
RDW: 16.2 % — ABNORMAL HIGH (ref 11.0–15.0)
Total Lymphocyte: 26.6 %
WBC: 7.2 10*3/uL (ref 3.8–10.8)

## 2023-05-11 LAB — TSH: TSH: 1.95 m[IU]/L

## 2023-05-11 LAB — TEST AUTHORIZATION

## 2023-05-13 DIAGNOSIS — R002 Palpitations: Secondary | ICD-10-CM | POA: Diagnosis not present

## 2023-05-13 DIAGNOSIS — R000 Tachycardia, unspecified: Secondary | ICD-10-CM

## 2023-05-17 ENCOUNTER — Other Ambulatory Visit: Payer: Self-pay

## 2023-05-17 ENCOUNTER — Inpatient Hospital Stay: Payer: BC Managed Care – PPO

## 2023-05-17 ENCOUNTER — Encounter: Payer: Self-pay | Admitting: Oncology

## 2023-05-17 ENCOUNTER — Inpatient Hospital Stay: Payer: BC Managed Care – PPO | Attending: Oncology | Admitting: Oncology

## 2023-05-17 VITALS — BP 127/99 | HR 121 | Temp 97.6°F | Resp 18 | Wt 213.1 lb

## 2023-05-17 DIAGNOSIS — R718 Other abnormality of red blood cells: Secondary | ICD-10-CM

## 2023-05-17 DIAGNOSIS — R000 Tachycardia, unspecified: Secondary | ICD-10-CM

## 2023-05-17 DIAGNOSIS — D75839 Thrombocytosis, unspecified: Secondary | ICD-10-CM

## 2023-05-17 DIAGNOSIS — D509 Iron deficiency anemia, unspecified: Secondary | ICD-10-CM | POA: Insufficient documentation

## 2023-05-17 DIAGNOSIS — R252 Cramp and spasm: Secondary | ICD-10-CM

## 2023-05-17 DIAGNOSIS — I10 Essential (primary) hypertension: Secondary | ICD-10-CM

## 2023-05-17 DIAGNOSIS — Z79899 Other long term (current) drug therapy: Secondary | ICD-10-CM | POA: Diagnosis not present

## 2023-05-17 DIAGNOSIS — M79605 Pain in left leg: Secondary | ICD-10-CM

## 2023-05-17 DIAGNOSIS — M25562 Pain in left knee: Secondary | ICD-10-CM

## 2023-05-17 LAB — CBC WITH DIFFERENTIAL/PLATELET
Abs Immature Granulocytes: 0.02 10*3/uL (ref 0.00–0.07)
Basophils Absolute: 0.1 10*3/uL (ref 0.0–0.1)
Basophils Relative: 1 %
Eosinophils Absolute: 0.1 10*3/uL (ref 0.0–0.5)
Eosinophils Relative: 1 %
HCT: 45.1 % (ref 36.0–46.0)
Hemoglobin: 14 g/dL (ref 12.0–15.0)
Immature Granulocytes: 0 %
Lymphocytes Relative: 29 %
Lymphs Abs: 2.5 10*3/uL (ref 0.7–4.0)
MCH: 23.1 pg — ABNORMAL LOW (ref 26.0–34.0)
MCHC: 31 g/dL (ref 30.0–36.0)
MCV: 74.5 fL — ABNORMAL LOW (ref 80.0–100.0)
Monocytes Absolute: 0.7 10*3/uL (ref 0.1–1.0)
Monocytes Relative: 8 %
Neutro Abs: 5.2 10*3/uL (ref 1.7–7.7)
Neutrophils Relative %: 61 %
Platelets: 410 10*3/uL — ABNORMAL HIGH (ref 150–400)
RBC: 6.05 MIL/uL — ABNORMAL HIGH (ref 3.87–5.11)
RDW: 16.9 % — ABNORMAL HIGH (ref 11.5–15.5)
WBC: 8.5 10*3/uL (ref 4.0–10.5)
nRBC: 0.2 % (ref 0.0–0.2)

## 2023-05-17 LAB — D-DIMER, QUANTITATIVE: D-Dimer, Quant: 0.34 ug{FEU}/mL (ref 0.00–0.50)

## 2023-05-17 LAB — LACTATE DEHYDROGENASE: LDH: 127 U/L (ref 98–192)

## 2023-05-17 LAB — HIV ANTIBODY (ROUTINE TESTING W REFLEX): HIV Screen 4th Generation wRfx: NONREACTIVE

## 2023-05-17 LAB — RETIC PANEL
Immature Retic Fract: 18.2 % — ABNORMAL HIGH (ref 2.3–15.9)
RBC.: 6.06 MIL/uL — ABNORMAL HIGH (ref 3.87–5.11)
Retic Count, Absolute: 100 10*3/uL (ref 19.0–186.0)
Retic Ct Pct: 1.7 % (ref 0.4–3.1)
Reticulocyte Hemoglobin: 24.8 pg — ABNORMAL LOW (ref >27.9)

## 2023-05-17 LAB — HEPATITIS PANEL, ACUTE
HCV Ab: NONREACTIVE
Hep A IgM: NONREACTIVE
Hep B C IgM: NONREACTIVE
Hepatitis B Surface Ag: NONREACTIVE

## 2023-05-17 NOTE — Assessment & Plan Note (Addendum)
Mildly elevated platelet count (418) with no recent infection or vaccination. Possible reactive process due to asymptomatic viral infection. -Repeat CBC today to confirm if elevation persists, reticulocyte HIV, hepatitis panel. Please blood work showed persistently elevated platelet count. Reticulocyte hemoglobin is decreased. ? Possible early iron deficiency.  Recommend patient to take oral iron supplementaio

## 2023-05-17 NOTE — Assessment & Plan Note (Signed)
Recent onset of persistent tachycardia and shortness of breath, even at rest. Currently wearing a heart monitor. -Check D-dimer today -within normal limit. -Order ultrasound of left lower extremity to rule out deep vein thrombosis due to recent leg cramp.

## 2023-05-17 NOTE — Progress Notes (Unsigned)
Hematology/Oncology Consult note [phone] [phone]        REFERRING PROVIDER: Berniece Salines, FNP   CHIEF COMPLAINTS/REASON FOR VISIT:  Evaluation of polycythemia and thrombocytosis.    ASSESSMENT & PLAN:   Thrombocytosis Mildly elevated platelet count (418) with no recent infection or vaccination. Possible reactive process due to asymptomatic viral infection. -Repeat CBC today to confirm if elevation persists, reticulocyte HIV, hepatitis panel. Please blood work showed persistently elevated platelet count. Reticulocyte hemoglobin is decreased. ? Possible early iron deficiency.  Recommend patient to take oral iron supplementaio  RBC microcytosis Persistent low MCV with normal iron levels. Possible underlying hemoglobinopathy Check hemoglobinopathy evaluation.  Tachycardia Recent onset of persistent tachycardia and shortness of breath, even at rest. Currently wearing a heart monitor. -Check D-dimer today -within normal limit. -Order ultrasound of left lower extremity to rule out deep vein thrombosis due to recent leg cramp.   Orders Placed This Encounter  Procedures   US Venous Img Lower Unilateral Left    Standing Status:   Future    Standing Expiration Date:   05/16/2024    Order Specific Question:   Reason for Exam (SYMPTOM  OR DIAGNOSIS REQUIRED)    Answer:   leg cramp, left leg pain, tachycardia    Order Specific Question:   Preferred imaging location?    Answer:   [facility]   CBC with Differential/Platelet    Standing Status:   Future    Number of Occurrences:   1    Standing Expiration Date:   05/16/2024   Hepatitis panel, acute    Standing Status:   Future    Number of Occurrences:   1    Standing Expiration Date:   05/16/2024   HIV Antibody (routine testing w rflx)    Standing Status:   Future    Number of Occurrences:   1    Standing Expiration Date:   05/16/2024   Lactate dehydrogenase    Standing Status:   Future     Number of Occurrences:   1    Standing Expiration Date:   05/16/2024   Retic Panel    Standing Status:   Future    Number of Occurrences:   1    Standing Expiration Date:   05/16/2024   Hgb Fractionation Cascade    Standing Status:   Future    Number of Occurrences:   1    Standing Expiration Date:   05/16/2024   D-dimer, quantitative    Standing Status:   Future    Number of Occurrences:   1    Standing Expiration Date:   05/16/2024   Technologist smear review    Standing Status:   Future    Standing Expiration Date:   05/16/2024    Order Specific Question:   Clinical information:    Answer:   thrombocytosis  Follow-up To be determined. All questions were answered. The patient knows to call the clinic with any problems, questions or concerns.  Rickard Patience, MD, PhD [facility] Hematology Oncology 05/17/2023   HISTORY OF PRESENTING ILLNESS:   Casey Schroeder is a  32 y.o.  female with PMH listed below was seen in consultation at the request of  Berniece Salines, FNP  for evaluation of polycythemia, thrombocytosis.   The patient's recent blood work showed a slightly elevated platelet count and consistently low MCV. Slightly increased platelet count 418,000.  She had a normal platelet count 92-month ago.  Hematocrit is slightly  increased at 48.7.  Normal hemoglobin 14.7.  She had a normal ferritin of 22, iron saturation 19.  Slightly increased TIBC 177. The patient denies any recent infections, smoking, recreational drug use, or significant changes in medication. She is currently on a multivitamin and Norvasc for blood pressure management.  Denies implants, tooth pain, or sinus issues, recent upper respiratory infections, vaccinations.  Patient also reports that she has had elevated heart rate and unintentional weight loss over the past 4-5 weeks. The patient reports experiencing tachycardia even during periods of rest, with heart rates reaching up to 149 bpm. This symptom was  initially noticed through a heart rate monitor on a personal device and has since been confirmed through a 14-day heart monitor prescribed by her primary care physician.  The patient also reports experiencing shortness of breath, particularly when climbing stairs or bending over, activities that did not previously cause such symptoms. She also describes a recent episode of a cramp in the ;left lower extremity, resembling a charley horse, which resolved on its own.  Patient denies any recent immobility events including airline or long distance road trip.  The patient also denies any family history of blood clots or severe anemia requiring frequent transfusions. However, she notes limited knowledge of her maternal family's medical history due to early deaths in the family.  She is currently not sexually active and denies any chance of pregnancy.     MEDICAL HISTORY:  Past Medical History:  Diagnosis Date   Appendicitis, acute 05/17/2013   Heart murmur    Hypertension    Uterine mass 05/17/2013    SURGICAL HISTORY: Past Surgical History:  Procedure Laterality Date   APPENDECTOMY     LAPAROSCOPIC APPENDECTOMY N/A 05/16/2013   Procedure: APPENDECTOMY LAPAROSCOPIC uterine biopsy;  Surgeon: Romie Levee, MD;  Location: [facility];  Service: General;  Laterality: N/A;   MYOMECTOMY N/A 10/18/2022   Procedure: ABDOMINAL MYOMECTOMY;  Surgeon: Shea Evans, MD;  Location[facility];  Service: Gynecology;  Laterality: N/A;  TAP BLOCK Requests 3hrs.    SOCIAL HISTORY: Social History   Socioeconomic History   Marital status: Single    Spouse name: Not on file   Number of children: Not on file   Years of education: Not on file   Highest education level: Bachelor's degree (e.g., BA, AB, BS)  Occupational History   Not on file  Tobacco Use   Smoking status: Never   Smokeless tobacco: Never  Vaping Use   Vaping status: Never Used  Substance and Sexual Activity   Alcohol use: Yes     Alcohol/week: 3.0 standard drinks of alcohol    Types: 3 Cans of beer per week    Comment: socially   Drug use: No   Sexual activity: Not Currently    Birth control/protection: None  Other Topics Concern   Not on file  Social History Narrative   6 grade school teacher   Social Determinants of Health   Financial Resource Strain: Low Risk  (05/08/2023)   Overall Financial Resource Strain (CARDIA)    Difficulty of Paying Living Expenses: Not very hard  Food Insecurity: No Food Insecurity (05/08/2023)   Hunger Vital Sign    Worried About Running Out of Food in the Last Year: Never true    Ran Out of Food in the Last Year: Never true  Transportation Needs: No Transportation Needs (05/08/2023)   PRAPARE - Administrator, Civil Service (Medical): No    Lack of Transportation (Non-Medical):  No  Physical Activity: Insufficiently Active (05/08/2023)   Exercise Vital Sign    Days of Exercise per Week: 3 days    Minutes of Exercise per Session: 20 min  Stress: Stress Concern Present (05/08/2023)   [employer] of Occupational Health - Occupational Stress Questionnaire    Feeling of Stress : To some extent  Social Connections: Moderately Integrated (05/08/2023)   Social Connection and Isolation Panel [NHANES]    Frequency of Communication with Friends and Family: More than three times a week    Frequency of Social Gatherings with Friends and Family: Three times a week    Attends Religious Services: More than 4 times per year    Active Member of Clubs or Organizations: Yes    Attends Banker Meetings: More than 4 times per year    Marital Status: Divorced  Intimate Partner Violence: Not At Risk (10/18/2022)   Humiliation, Afraid, Rape, and Kick questionnaire    Fear of Current or Ex-Partner: No    Emotionally Abused: No    Physically Abused: No    Sexually Abused: No    FAMILY HISTORY: Family History  Problem Relation Age of Onset   Cancer Maternal Aunt         breast   Breast cancer Maternal Aunt 30    ALLERGIES:  is allergic to beef-derived products, chicken protein, fish-derived products, and pork-derived products.  MEDICATIONS:  Current Outpatient Medications  Medication Sig Dispense Refill   amLODipine (NORVASC) 2.5 MG tablet TAKE 1 TABLET BY MOUTH EVERY DAY 90 tablet 1   Multiple Vitamin (MULTIVITAMIN) capsule Take 1 capsule by mouth daily.     No current facility-administered medications for this visit.    Review of Systems  Constitutional:  Positive for appetite change and unexpected weight change. Negative for chills, fatigue and fever.  HENT:   Negative for hearing loss and voice change.   Eyes:  Negative for eye problems.  Respiratory:  Positive for shortness of breath. Negative for chest tightness and cough.   Cardiovascular:  Positive for palpitations. Negative for chest pain.  Gastrointestinal:  Negative for abdominal distention, abdominal pain and blood in stool.  Endocrine: Negative for hot flashes.  Genitourinary:  Negative for difficulty urinating and frequency.   Musculoskeletal:  Negative for arthralgias.  Skin:  Negative for itching and rash.  Neurological:  Negative for extremity weakness.  Hematological:  Negative for adenopathy.  Psychiatric/Behavioral:  Negative for confusion.    PHYSICAL EXAMINATION:  Vitals:   05/17/23 1525  BP: (!) 127/99  Pulse: (!) 121  Resp: 18  Temp: 97.6 F (36.4 C)  SpO2: 100%   Filed Weights   05/17/23 1525  Weight: 213 lb 1.6 oz (96.7 kg)    Physical Exam Constitutional:      General: She is not in acute distress. HENT:     Head: Normocephalic and atraumatic.  Eyes:     General: No scleral icterus. Cardiovascular:     Rate and Rhythm: Tachycardia present.  Pulmonary:     Effort: Pulmonary effort is normal. No respiratory distress.     Breath sounds: No wheezing.  Abdominal:     General: Bowel sounds are normal. There is no distension.     Palpations:  Abdomen is soft.  Musculoskeletal:        General: No deformity. Normal range of motion.     Cervical back: Normal range of motion and neck supple.     Right lower leg: No edema.  Left lower leg: No edema.  Skin:    General: Skin is warm and dry.     Findings: No erythema or rash.  Neurological:     Mental Status: She is alert and oriented to person, place, and time. Mental status is at baseline.     Cranial Nerves: No cranial nerve deficit.  Psychiatric:        Mood and Affect: Mood normal.    LABORATORY DATA:  I have reviewed the data as listed    Latest Ref Rng & Units 05/17/2023    4:09 PM 05/09/2023    8:03 AM 12/21/2022   11:55 AM  CBC  WBC 4.0 - 10.5 K/uL 8.5  7.2  9.5   Hemoglobin 12.0 - 15.0 g/dL 40.9  81.1  91.4   Hematocrit 36.0 - 46.0 % 45.1  48.7  41.5   Platelets 150 - 400 K/uL 410  418  279       Latest Ref Rng & Units 05/09/2023    8:03 AM 12/21/2022   11:55 AM 10/12/2022    8:41 AM  CMP  Glucose 65 - 99 mg/dL 83  98  93   BUN 7 - 25 mg/dL 8  13  6    Creatinine 0.50 - 0.97 mg/dL 7.82  9.56  2.13   Sodium 135 - 146 mmol/L 137  135  136   Potassium 3.5 - 5.3 mmol/L 4.4  4.0  4.1   Chloride 98 - 110 mmol/L 102  102  103   CO2 20 - 32 mmol/L 23  24  24    Calcium 8.6 - 10.2 mg/dL 08.6  9.2  9.1   Total Protein 6.1 - 8.1 g/dL 8.2     Total Bilirubin 0.2 - 1.2 mg/dL 0.5     AST 10 - 30 U/L 22     ALT 6 - 29 U/L 21         RADIOGRAPHIC STUDIES: I have personally reviewed the radiological images as listed and agreed with the findings in the report. No results found.

## 2023-05-17 NOTE — Assessment & Plan Note (Signed)
Persistent low MCV with normal iron levels. Possible underlying hemoglobinopathy Check hemoglobinopathy evaluation.

## 2023-05-18 ENCOUNTER — Telehealth: Payer: Self-pay

## 2023-05-18 NOTE — Telephone Encounter (Signed)
Please schedule Lab/MD in approx 1 month

## 2023-05-18 NOTE — Telephone Encounter (Signed)
-----   Message from Rickard Patience sent at 05/18/2023  9:39 AM EDT ----- Let patient know that the D Dimer is within normal limits, therefore the chance of acute thrombosis is fairly low. Still recommend her to get [place] LLE which is scheduled.  Platelet count has slightly improved, still mildly elevated. I recommend her to take oral ferrous sulfate 325mg  daily [otc supply] for one month. Arrange follow up 1 month lab MD cbc iron tibc ferritin. We will repeat lab to trend platelet count and decide what additional work up she needs. Will also go over blood work results she did yesterday.  Thanks.

## 2023-05-19 LAB — HGB FRACTIONATION CASCADE
Hgb A2: 2.3 % (ref 1.8–3.2)
Hgb A: 97.7 % (ref 96.4–98.8)
Hgb F: 0 % (ref 0.0–2.0)
Hgb S: 0 %

## 2023-05-23 ENCOUNTER — Ambulatory Visit
Admission: RE | Admit: 2023-05-23 | Discharge: 2023-05-23 | Disposition: A | Discharge status: Home / Self Care | Payer: BC Managed Care – PPO | Source: Ambulatory Visit | Attending: Oncology | Admitting: Oncology

## 2023-05-23 DIAGNOSIS — R252 Cramp and spasm: Secondary | ICD-10-CM | POA: Insufficient documentation

## 2023-05-23 DIAGNOSIS — R000 Tachycardia, unspecified: Secondary | ICD-10-CM | POA: Insufficient documentation

## 2023-05-25 ENCOUNTER — Other Ambulatory Visit: Payer: Self-pay | Admitting: Nurse Practitioner

## 2023-05-25 DIAGNOSIS — F5105 Insomnia due to other mental disorder: Secondary | ICD-10-CM

## 2023-05-25 DIAGNOSIS — F419 Anxiety disorder, unspecified: Secondary | ICD-10-CM

## 2023-05-25 DIAGNOSIS — F330 Major depressive disorder, recurrent, mild: Secondary | ICD-10-CM

## 2023-05-25 NOTE — Telephone Encounter (Signed)
Requested medication (s) are due for refill today: not on profile  Requested medication (s) are on the active medication list: no  Last refill:  02/24/23  Future visit scheduled: no  Notes to clinic:  dc'd 05/09/23  "pt not taking"   Requested Prescriptions  Pending Prescriptions Disp Refills   hydrOXYzine (VISTARIL) 25 MG capsule [Pharmacy Med Name: HYDROXYZINE PAM 25 MG CAP] 90 capsule 0    Sig: TAKE 1 CAPSULE (25 MG TOTAL) BY MOUTH AT BEDTIME AS NEEDED FOR ANXIETY.     Ear, Nose, and Throat:  Antihistamines 2 Passed - 05/25/2023  1:24 AM      Passed - Cr in normal range and within 360 days    Creat  Date Value Ref Range Status  05/09/2023 0.86 0.50 - 0.97 mg/dL Final         Passed - Valid encounter within last 12 months    Recent Outpatient Visits           2 weeks ago Palpitations   [facility] Della Goo F, FNP   3 months ago Mild episode of recurrent major depressive disorder [facility])   [facility] Della Goo F, FNP   4 months ago Mild episode of recurrent major depressive disorder [facility])   [facility] Berniece Salines, FNP   9 months ago Left lower quadrant abdominal pain   [facility] Berniece Salines, FNP   1 year ago Encounter for annual physical exam   [facility] Berniece Salines, FNP       Future Appointments             In 1 month Agbor-Etang, Arlys John, MD [address] [facility]

## 2023-05-31 ENCOUNTER — Other Ambulatory Visit: Payer: Self-pay

## 2023-05-31 NOTE — Telephone Encounter (Signed)
[place] to move up appt ?

## 2023-05-31 NOTE — Progress Notes (Signed)
Error

## 2023-06-01 ENCOUNTER — Other Ambulatory Visit: Payer: Self-pay

## 2023-06-01 DIAGNOSIS — D75839 Thrombocytosis, unspecified: Secondary | ICD-10-CM

## 2023-06-06 ENCOUNTER — Other Ambulatory Visit: Payer: Self-pay

## 2023-06-06 DIAGNOSIS — D75839 Thrombocytosis, unspecified: Secondary | ICD-10-CM

## 2023-06-07 ENCOUNTER — Inpatient Hospital Stay: Payer: BC Managed Care – PPO | Attending: Oncology

## 2023-06-07 DIAGNOSIS — D509 Iron deficiency anemia, unspecified: Secondary | ICD-10-CM | POA: Insufficient documentation

## 2023-06-07 DIAGNOSIS — D75839 Thrombocytosis, unspecified: Secondary | ICD-10-CM

## 2023-06-07 DIAGNOSIS — R718 Other abnormality of red blood cells: Secondary | ICD-10-CM

## 2023-06-07 DIAGNOSIS — Z79899 Other long term (current) drug therapy: Secondary | ICD-10-CM | POA: Diagnosis not present

## 2023-06-07 DIAGNOSIS — R252 Cramp and spasm: Secondary | ICD-10-CM

## 2023-06-07 LAB — CBC WITH DIFFERENTIAL (CANCER CENTER ONLY)
Abs Immature Granulocytes: 0.04 10*3/uL (ref 0.00–0.07)
Basophils Absolute: 0 10*3/uL (ref 0.0–0.1)
Basophils Relative: 0 %
Eosinophils Absolute: 0.1 10*3/uL (ref 0.0–0.5)
Eosinophils Relative: 1 %
HCT: 41.8 % (ref 36.0–46.0)
Hemoglobin: 13.2 g/dL (ref 12.0–15.0)
Immature Granulocytes: 0 %
Lymphocytes Relative: 22 %
Lymphs Abs: 2.1 10*3/uL (ref 0.7–4.0)
MCH: 23.4 pg — ABNORMAL LOW (ref 26.0–34.0)
MCHC: 31.6 g/dL (ref 30.0–36.0)
MCV: 74.1 fL — ABNORMAL LOW (ref 80.0–100.0)
Monocytes Absolute: 0.7 10*3/uL (ref 0.1–1.0)
Monocytes Relative: 7 %
Neutro Abs: 6.5 10*3/uL (ref 1.7–7.7)
Neutrophils Relative %: 70 %
Platelet Count: 326 10*3/uL (ref 150–400)
RBC: 5.64 MIL/uL — ABNORMAL HIGH (ref 3.87–5.11)
RDW: 16.2 % — ABNORMAL HIGH (ref 11.5–15.5)
WBC Count: 9.5 10*3/uL (ref 4.0–10.5)
nRBC: 0 % (ref 0.0–0.2)

## 2023-06-07 LAB — IRON AND TIBC
Iron: 59 ug/dL (ref 28–170)
Saturation Ratios: 12 % (ref 10.4–31.8)
TIBC: 497 ug/dL — ABNORMAL HIGH (ref 250–450)
UIBC: 438 ug/dL

## 2023-06-07 LAB — TECHNOLOGIST SMEAR REVIEW
Plt Morphology: NORMAL
RBC MORPHOLOGY: NORMAL
WBC MORPHOLOGY: NORMAL

## 2023-06-07 LAB — FERRITIN: Ferritin: 13 ng/mL (ref 11–307)

## 2023-06-15 ENCOUNTER — Other Ambulatory Visit: Payer: BC Managed Care – PPO

## 2023-06-15 ENCOUNTER — Inpatient Hospital Stay (HOSPITAL_BASED_OUTPATIENT_CLINIC_OR_DEPARTMENT_OTHER): Payer: BC Managed Care – PPO | Admitting: Oncology

## 2023-06-15 ENCOUNTER — Encounter: Payer: Self-pay | Admitting: Oncology

## 2023-06-15 VITALS — BP 135/91 | HR 104 | Temp 97.5°F | Resp 18 | Wt 222.1 lb

## 2023-06-15 DIAGNOSIS — R000 Tachycardia, unspecified: Secondary | ICD-10-CM

## 2023-06-15 DIAGNOSIS — R718 Other abnormality of red blood cells: Secondary | ICD-10-CM | POA: Diagnosis not present

## 2023-06-15 DIAGNOSIS — D75839 Thrombocytosis, unspecified: Secondary | ICD-10-CM

## 2023-06-15 DIAGNOSIS — D509 Iron deficiency anemia, unspecified: Secondary | ICD-10-CM | POA: Diagnosis not present

## 2023-06-15 NOTE — Assessment & Plan Note (Signed)
Resolved,. Likely due to iron deficiency

## 2023-06-15 NOTE — Assessment & Plan Note (Signed)
Likely due to early iron deficiency.  Recommend patient to continue ferrous sulfate 325mg  1-2 time daily. Add otc vitamin C supplementation.  Normal hemoglobinopathy evaluation.  Pending alpha thalassemia screening.

## 2023-06-15 NOTE — Progress Notes (Signed)
Hematology/Oncology Progress note [phone] [phone]           REFERRING PROVIDER: Berniece Salines, FNP   CHIEF COMPLAINTS/REASON FOR VISIT:  Microcytic anemia.    ASSESSMENT & PLAN:   Thrombocytosis Resolved,. Likely due to iron deficiency  Tachycardia Normal D Dimer. Negative left lower extremity venous ultrasound.  Recent onset of persistent tachycardia and shortness of breath, one episode of syncope Pending cardiology work up  RBC microcytosis Likely due to early iron deficiency.  Recommend patient to continue ferrous sulfate 325mg  1-2 time daily. Add otc vitamin C supplementation.  Normal hemoglobinopathy evaluation.  Pending alpha thalassemia screening.     Orders Placed This Encounter  Procedures   CBC with Differential (Cancer Center Only)    Standing Status:   Future    Standing Expiration Date:   06/14/2024   Iron and TIBC    Standing Status:   Future    Standing Expiration Date:   06/14/2024   Ferritin    Standing Status:   Future    Standing Expiration Date:   06/14/2024   Retic Panel    Standing Status:   Future    Standing Expiration Date:   06/14/2024   Follow-up 6 months lab MD  All questions were answered. The patient knows to call the clinic with any problems, questions or concerns.  Rickard Patience, MD, PhD [facility] Hematology Oncology 06/15/2023   HISTORY OF PRESENTING ILLNESS:   Casey Schroeder is a  32 y.o.  female with PMH listed below was seen in consultation at the request of  Berniece Salines, FNP  for evaluation of polycythemia, thrombocytosis.   The patient's recent blood work showed a slightly elevated platelet count and consistently low MCV. Slightly increased platelet count 418,000.  She had a normal platelet count 3-month ago.  Hematocrit is slightly increased at 48.7.  Normal hemoglobin 14.7.  She had a normal ferritin of 22, iron saturation 19.  Slightly increased TIBC 177. The patient denies any  recent infections, smoking, recreational drug use, or significant changes in medication. She is currently on a multivitamin and Norvasc for blood pressure management.  Denies implants, tooth pain, or sinus issues, recent upper respiratory infections, vaccinations.  Patient also reports that she has had elevated heart rate and unintentional weight loss over the past 4-5 weeks. The patient reports experiencing tachycardia even during periods of rest, with heart rates reaching up to 149 bpm. This symptom was initially noticed through a heart rate monitor on a personal device and has since been confirmed through a 14-day heart monitor prescribed by her primary care physician.  The patient also reports experiencing shortness of breath, particularly when climbing stairs or bending over, activities that did not previously cause such symptoms. She also describes a recent episode of a cramp in the ;left lower extremity, resembling a charley horse, which resolved on its own.  Patient denies any recent immobility events including airline or long distance road trip.  The patient also denies any family history of blood clots or severe anemia requiring frequent transfusions. However, she notes limited knowledge of her maternal family's medical history due to early deaths in the family.  She is currently not sexually active and denies any chance of pregnancy.   INTERVAL HISTORY Casey Schroeder is a 32 y.o. female who has above history reviewed by me today presents for follow up visit for microcytic anemia.  She takes oral iron supplementation once daily. Tolerates well.  She  has an episode of syncope in the parking lot when she visited [place]. She did not seek medical advise.  Overall she feels better, palpation is better.   MEDICAL HISTORY:  Past Medical History:  Diagnosis Date   Appendicitis, acute 05/17/2013   Heart murmur    Hypertension    Uterine mass 05/17/2013    SURGICAL HISTORY: Past  Surgical History:  Procedure Laterality Date   APPENDECTOMY     LAPAROSCOPIC APPENDECTOMY N/A 05/16/2013   Procedure: APPENDECTOMY LAPAROSCOPIC uterine biopsy;  Surgeon: Romie Levee, MD;  Location: [facility];  Service: General;  Laterality: N/A;   MYOMECTOMY N/A 10/18/2022   Procedure: ABDOMINAL MYOMECTOMY;  Surgeon: Shea Evans, MD;  Location: [facility] OR;  Service: Gynecology;  Laterality: N/A;  TAP BLOCK Requests 3hrs.    SOCIAL HISTORY: Social History   Socioeconomic History   Marital status: Single    Spouse name: Not on file   Number of children: Not on file   Years of education: Not on file   Highest education level: Bachelor's degree (e.g., BA, AB, BS)  Occupational History   Not on file  Tobacco Use   Smoking status: Never   Smokeless tobacco: Never  Vaping Use   Vaping status: Never Used  Substance and Sexual Activity   Alcohol use: Yes    Alcohol/week: 3.0 standard drinks of alcohol    Types: 3 Cans of beer per week    Comment: socially   Drug use: No   Sexual activity: Not Currently    Birth control/protection: None  Other Topics Concern   Not on file  Social History Narrative   6 grade school teacher   Social Determinants of Health   Financial Resource Strain: Low Risk  (05/08/2023)   Overall Financial Resource Strain (CARDIA)    Difficulty of Paying Living Expenses: Not very hard  Food Insecurity: No Food Insecurity (05/08/2023)   Hunger Vital Sign    Worried About Running Out of Food in the Last Year: Never true    Ran Out of Food in the Last Year: Never true  Transportation Needs: No Transportation Needs (05/08/2023)   PRAPARE - Administrator, Civil Service (Medical): No    Lack of Transportation (Non-Medical): No  Physical Activity: Insufficiently Active (05/08/2023)   Exercise Vital Sign    Days of Exercise per Week: 3 days    Minutes of Exercise per Session: 20 min  Stress: Stress Concern Present (05/08/2023)   [employer] of  Occupational Health - Occupational Stress Questionnaire    Feeling of Stress : To some extent  Social Connections: Moderately Integrated (05/08/2023)   Social Connection and Isolation Panel [NHANES]    Frequency of Communication with Friends and Family: More than three times a week    Frequency of Social Gatherings with Friends and Family: Three times a week    Attends Religious Services: More than 4 times per year    Active Member of Clubs or Organizations: Yes    Attends Banker Meetings: More than 4 times per year    Marital Status: Divorced  Intimate Partner Violence: Not At Risk (10/18/2022)   Humiliation, Afraid, Rape, and Kick questionnaire    Fear of Current or Ex-Partner: No    Emotionally Abused: No    Physically Abused: No    Sexually Abused: No    FAMILY HISTORY: Family History  Problem Relation Age of Onset   Cancer Maternal Aunt        breast  Breast cancer Maternal Aunt 30    ALLERGIES:  is allergic to beef-derived products, chicken protein, fish-derived products, and pork-derived products.  MEDICATIONS:  Current Outpatient Medications  Medication Sig Dispense Refill   amLODipine (NORVASC) 2.5 MG tablet TAKE 1 TABLET BY MOUTH EVERY DAY 90 tablet 1   ferrous sulfate 324 MG TBEC Take 324 mg by mouth daily with breakfast.     hydrOXYzine (VISTARIL) 25 MG capsule TAKE 1 CAPSULE (25 MG TOTAL) BY MOUTH AT BEDTIME AS NEEDED FOR ANXIETY. 90 capsule 0   Multiple Vitamin (MULTIVITAMIN) capsule Take 1 capsule by mouth daily.     No current facility-administered medications for this visit.    Review of Systems  Constitutional:  Positive for appetite change. Negative for chills, fatigue and fever.  HENT:   Negative for hearing loss and voice change.   Eyes:  Negative for eye problems.  Respiratory:  Positive for shortness of breath. Negative for chest tightness and cough.   Cardiovascular:  Positive for palpitations. Negative for chest pain.   Gastrointestinal:  Negative for abdominal distention, abdominal pain and blood in stool.  Endocrine: Negative for hot flashes.  Genitourinary:  Negative for difficulty urinating and frequency.   Musculoskeletal:  Negative for arthralgias.  Skin:  Negative for itching and rash.  Neurological:  Negative for extremity weakness.  Hematological:  Negative for adenopathy.  Psychiatric/Behavioral:  Negative for confusion.    PHYSICAL EXAMINATION:  Vitals:   06/15/23 1442  BP: (!) 135/91  Pulse: (!) 104  Resp: 18  Temp: (!) 97.5 F (36.4 C)   Filed Weights   06/15/23 1442  Weight: 222 lb 1.6 oz (100.7 kg)    Physical Exam Constitutional:      General: She is not in acute distress. HENT:     Head: Normocephalic and atraumatic.  Eyes:     General: No scleral icterus. Cardiovascular:     Rate and Rhythm: Tachycardia present.  Pulmonary:     Effort: Pulmonary effort is normal. No respiratory distress.     Breath sounds: No wheezing.  Abdominal:     General: Bowel sounds are normal. There is no distension.     Palpations: Abdomen is soft.  Musculoskeletal:        General: No deformity. Normal range of motion.     Cervical back: Normal range of motion and neck supple.     Right lower leg: No edema.     Left lower leg: No edema.  Skin:    General: Skin is warm and dry.     Findings: No erythema or rash.  Neurological:     Mental Status: She is alert and oriented to person, place, and time. Mental status is at baseline.     Cranial Nerves: No cranial nerve deficit.  Psychiatric:        Mood and Affect: Mood normal.     LABORATORY DATA:  I have reviewed the data as listed    Latest Ref Rng & Units 06/07/2023    3:39 PM 05/17/2023    4:09 PM 05/09/2023    8:03 AM  CBC  WBC 4.0 - 10.5 K/uL 9.5  8.5  7.2   Hemoglobin 12.0 - 15.0 g/dL 13.0  86.5  78.4   Hematocrit 36.0 - 46.0 % 41.8  45.1  48.7   Platelets 150 - 400 K/uL 326  410  418       Latest Ref Rng & Units  05/09/2023    8:03 AM 12/21/2022  11:55 AM 10/12/2022    8:41 AM  CMP  Glucose 65 - 99 mg/dL 83  98  93   BUN 7 - 25 mg/dL 8  13  6    Creatinine 0.50 - 0.97 mg/dL 5.10  2.58  5.27   Sodium 135 - 146 mmol/L 137  135  136   Potassium 3.5 - 5.3 mmol/L 4.4  4.0  4.1   Chloride 98 - 110 mmol/L 102  102  103   CO2 20 - 32 mmol/L 23  24  24    Calcium 8.6 - 10.2 mg/dL 78.2  9.2  9.1   Total Protein 6.1 - 8.1 g/dL 8.2     Total Bilirubin 0.2 - 1.2 mg/dL 0.5     AST 10 - 30 U/L 22     ALT 6 - 29 U/L 21         RADIOGRAPHIC STUDIES: I have personally reviewed the radiological images as listed and agreed with the findings in the report. LONG TERM MONITOR (3-14 DAYS)  Result Date: 06/07/2023 HR 51 - 166, average 98 bpm. Rare supraventricular and ventricular ectopy. No sustained arrhythmias. No atrial fibrillation. Symptom trigger episodes correspond to sinus rhythm. Sheria Lang T. Lalla Brothers, MD, [facility], [facility] Behavioral Cardiac Electrophysiology   US Venous Img Lower Unilateral Left  Result Date: 05/25/2023 CLINICAL DATA:  Left lower extremity pain.  Evaluate for DVT. EXAM: LEFT LOWER EXTREMITY VENOUS DOPPLER ULTRASOUND TECHNIQUE: Gray-scale sonography with graded compression, as well as color Doppler and duplex ultrasound were performed to evaluate the lower extremity deep venous systems from the level of the common femoral vein and including the common femoral, femoral, profunda femoral, popliteal and calf veins including the posterior tibial, peroneal and gastrocnemius veins when visible. The superficial great saphenous vein was also interrogated. Spectral Doppler was utilized to evaluate flow at rest and with distal augmentation maneuvers in the common femoral, femoral and popliteal veins. COMPARISON:  None Available. FINDINGS: Contralateral Common Femoral Vein: Respiratory phasicity is normal and symmetric with the symptomatic side. No evidence of thrombus. Normal compressibility. Common Femoral Vein: No evidence  of thrombus. Normal compressibility, respiratory phasicity and response to augmentation. Saphenofemoral Junction: No evidence of thrombus. Normal compressibility and flow on color Doppler imaging. Profunda Femoral Vein: No evidence of thrombus. Normal compressibility and flow on color Doppler imaging. Femoral Vein: No evidence of thrombus. Normal compressibility, respiratory phasicity and response to augmentation. Popliteal Vein: No evidence of thrombus. Normal compressibility, respiratory phasicity and response to augmentation. Calf Veins: No evidence of thrombus. Normal compressibility and flow on color Doppler imaging. Superficial Great Saphenous Vein: No evidence of thrombus. Normal compressibility. Other Findings:  None. IMPRESSION: No evidence of DVT within the left lower extremity. Electronically Signed   By: Simonne Come M.D.   On: 05/25/2023 18:10

## 2023-06-15 NOTE — Assessment & Plan Note (Addendum)
Normal D Dimer. Negative left lower extremity venous ultrasound.  Recent onset of persistent tachycardia and shortness of breath, one episode of syncope Pending cardiology work up

## 2023-06-21 LAB — ALPHA-THALASSEMIA GENOTYPR

## 2023-07-07 ENCOUNTER — Ambulatory Visit: Payer: BC Managed Care – PPO | Admitting: Cardiology

## 2023-07-26 ENCOUNTER — Other Ambulatory Visit: Payer: Self-pay | Admitting: Medical Genetics

## 2023-07-29 ENCOUNTER — Other Ambulatory Visit
Admission: RE | Admit: 2023-07-29 | Discharge: 2023-07-29 | Disposition: A | Discharge status: Home / Self Care | Payer: Self-pay | Source: Ambulatory Visit | Attending: Medical Genetics | Admitting: Medical Genetics

## 2023-08-15 ENCOUNTER — Other Ambulatory Visit: Payer: BC Managed Care – PPO

## 2023-08-15 ENCOUNTER — Ambulatory Visit: Payer: BC Managed Care – PPO | Admitting: Oncology

## 2023-08-25 LAB — GENECONNECT MOLECULAR SCREEN: Genetic Analysis Overall Interpretation: NEGATIVE

## 2023-11-05 ENCOUNTER — Other Ambulatory Visit: Payer: Self-pay | Admitting: Nurse Practitioner

## 2023-11-05 DIAGNOSIS — I10 Essential (primary) hypertension: Secondary | ICD-10-CM

## 2023-11-07 NOTE — Telephone Encounter (Signed)
 Courtesy refill. Patient will need an office visit for additional refills. Requested Prescriptions  Pending Prescriptions Disp Refills   amLODipine (NORVASC) 2.5 MG tablet [Pharmacy Med Name: AMLODIPINE BESYLATE 2.5 MG TAB] 30 tablet 0    Sig: TAKE 1 TABLET BY MOUTH EVERY DAY     Cardiovascular: Calcium Channel Blockers 2 Failed - 11/07/2023 12:35 PM      Failed - Last BP in normal range    BP Readings from Last 1 Encounters:  06/15/23 (!) 135/91         Failed - Valid encounter within last 6 months    Recent Outpatient Visits   None            Passed - Last Heart Rate in normal range    Pulse Readings from Last 1 Encounters:  06/15/23 (!) 104

## 2023-11-07 NOTE — Telephone Encounter (Signed)
Called pt  - left message on machine to return call and schedule appt. 

## 2023-12-13 ENCOUNTER — Inpatient Hospital Stay: Payer: BC Managed Care – PPO | Attending: Oncology

## 2023-12-13 DIAGNOSIS — D75839 Thrombocytosis, unspecified: Secondary | ICD-10-CM | POA: Insufficient documentation

## 2023-12-13 DIAGNOSIS — R7989 Other specified abnormal findings of blood chemistry: Secondary | ICD-10-CM | POA: Insufficient documentation

## 2023-12-13 LAB — CBC WITH DIFFERENTIAL (CANCER CENTER ONLY)
Abs Immature Granulocytes: 0.03 10*3/uL (ref 0.00–0.07)
Basophils Absolute: 0.1 10*3/uL (ref 0.0–0.1)
Basophils Relative: 1 %
Eosinophils Absolute: 0.2 10*3/uL (ref 0.0–0.5)
Eosinophils Relative: 2 %
HCT: 40.3 % (ref 36.0–46.0)
Hemoglobin: 12.7 g/dL (ref 12.0–15.0)
Immature Granulocytes: 0 %
Lymphocytes Relative: 31 %
Lymphs Abs: 2.5 10*3/uL (ref 0.7–4.0)
MCH: 23.3 pg — ABNORMAL LOW (ref 26.0–34.0)
MCHC: 31.5 g/dL (ref 30.0–36.0)
MCV: 74.1 fL — ABNORMAL LOW (ref 80.0–100.0)
Monocytes Absolute: 0.6 10*3/uL (ref 0.1–1.0)
Monocytes Relative: 8 %
Neutro Abs: 4.6 10*3/uL (ref 1.7–7.7)
Neutrophils Relative %: 58 %
Platelet Count: 334 10*3/uL (ref 150–400)
RBC: 5.44 MIL/uL — ABNORMAL HIGH (ref 3.87–5.11)
RDW: 15.9 % — ABNORMAL HIGH (ref 11.5–15.5)
WBC Count: 8 10*3/uL (ref 4.0–10.5)
nRBC: 0 % (ref 0.0–0.2)

## 2023-12-13 LAB — IRON AND TIBC
Iron: 101 ug/dL (ref 28–170)
Saturation Ratios: 21 % (ref 10.4–31.8)
TIBC: 486 ug/dL — ABNORMAL HIGH (ref 250–450)
UIBC: 385 ug/dL

## 2023-12-13 LAB — RETIC PANEL
Immature Retic Fract: 14.8 % (ref 2.3–15.9)
RBC.: 5.41 MIL/uL — ABNORMAL HIGH (ref 3.87–5.11)
Retic Count, Absolute: 79 10*3/uL (ref 19.0–186.0)
Retic Ct Pct: 1.5 % (ref 0.4–3.1)
Reticulocyte Hemoglobin: 26.1 pg — ABNORMAL LOW (ref >27.9)

## 2023-12-13 LAB — FERRITIN: Ferritin: 16 ng/mL (ref 11–307)

## 2023-12-20 ENCOUNTER — Ambulatory Visit: Payer: BC Managed Care – PPO | Admitting: Oncology

## 2024-01-03 ENCOUNTER — Inpatient Hospital Stay: Payer: Self-pay | Attending: Oncology | Admitting: Oncology

## 2024-01-03 ENCOUNTER — Encounter: Payer: Self-pay | Admitting: Oncology

## 2024-01-03 VITALS — BP 146/109 | HR 75 | Temp 96.8°F | Resp 18 | Wt 222.2 lb

## 2024-01-03 DIAGNOSIS — D509 Iron deficiency anemia, unspecified: Secondary | ICD-10-CM | POA: Diagnosis not present

## 2024-01-03 DIAGNOSIS — D563 Thalassemia minor: Secondary | ICD-10-CM | POA: Diagnosis not present

## 2024-01-03 DIAGNOSIS — R718 Other abnormality of red blood cells: Secondary | ICD-10-CM

## 2024-01-03 NOTE — Progress Notes (Signed)
 Hematology/Oncology Progress note [phone] [phone]           REFERRING PROVIDER: Quinton Buckler, FNP   CHIEF COMPLAINTS/REASON FOR VISIT:  Microcytic anemia.    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 Add otc vitamin C supplementation.    Alpha thalassemia minor Discussed with patient that Alpha thalassemia trait is a genetic condition where a person has one or two mutated genes for alpha globin, leading to mild anemia but usually no serious health issues.  Parents with alpha thalassemia trait can pass it on to their children, so I suggest that her family members undergo screening and consider genetic counseling if they are planning to start a family.    Orders Placed This Encounter  Procedures   CBC with Differential (Cancer Center Only)    Standing Status:   Future    Expected Date:   01/02/2025    Expiration Date:   01/02/2025   Iron and TIBC    Standing Status:   Future    Expected Date:   01/02/2025    Expiration Date:   01/02/2025   Ferritin    Standing Status:   Future    Expected Date:   01/02/2025    Expiration Date:   01/02/2025   Follow-up 12 months lab MD  All questions were answered. The patient knows to call the clinic with any problems, questions or concerns.  Casey Forbes, MD, PhD [facility] Hematology Oncology 01/03/2024   HISTORY OF PRESENTING ILLNESS:   Casey Schroeder is a  33 y.o.  female with PMH listed below was seen in consultation at the request of  Quinton Buckler, FNP  for evaluation of polycythemia, thrombocytosis.   The patient's recent blood work showed a slightly elevated platelet count and consistently  low MCV. Slightly increased platelet count 418,000.  She had a normal platelet count 73-month ago.  Hematocrit is slightly increased at 48.7.  Normal hemoglobin 14.7.  She had a normal ferritin of 22, iron saturation 19.  Slightly increased TIBC 177. The patient denies any recent infections, smoking, recreational drug use, or significant changes in medication. She is currently on a multivitamin and Norvasc  for blood pressure management.  Denies implants, tooth pain, or sinus issues, recent upper respiratory infections, vaccinations.  Patient also reports that she has had elevated heart rate and unintentional weight loss over the past 4-5 weeks. The patient reports experiencing tachycardia even during periods of rest, with heart rates reaching up to 149 bpm. This symptom was initially noticed through a heart rate monitor on a personal device and has since been confirmed through a 14-day heart monitor prescribed by her primary care physician.  The patient also reports experiencing shortness of breath, particularly when climbing stairs or bending over, activities that did not previously cause such symptoms. She also describes a recent episode of a cramp in the ;left lower extremity, resembling a charley horse, which resolved on its own.  Patient denies any recent immobility events including airline or long distance road trip.  The patient also denies any family history of blood clots or severe anemia requiring frequent transfusions. However, she notes limited knowledge of her  maternal family's medical history due to early deaths in the family.  She is currently not sexually active and denies any chance of pregnancy.   INTERVAL HISTORY Casey Schroeder is a 33 y.o. female who has above history reviewed by me today presents for follow up visit for microcytic anemia.  She takes oral iron supplementation every other day.  She reports feeling well.  Palpitation has resolved.  No new complaints.  MEDICAL  HISTORY:  Past Medical History:  Diagnosis Date   Appendicitis, acute 05/17/2013   Heart murmur    Hypertension    Uterine mass 05/17/2013    SURGICAL HISTORY: Past Surgical History:  Procedure Laterality Date   APPENDECTOMY     LAPAROSCOPIC APPENDECTOMY N/A 05/16/2013   Procedure: APPENDECTOMY LAPAROSCOPIC uterine biopsy;  Surgeon: Joyce Nixon, MD;  Location: [facility] ORS;  Service: General;  Laterality: N/A;   MYOMECTOMY N/A 10/18/2022   Procedure: ABDOMINAL MYOMECTOMY;  Surgeon: Terri Fester, MD;  Location: [facility] OR;  Service: Gynecology;  Laterality: N/A;  TAP BLOCK Requests 3hrs.    SOCIAL HISTORY: Social History   Socioeconomic History   Marital status: Single    Spouse name: Not on file   Number of children: Not on file   Years of education: Not on file   Highest education level: Bachelor's degree (e.g., BA, AB, BS)  Occupational History   Not on file  Tobacco Use   Smoking status: Never   Smokeless tobacco: Never  Vaping Use   Vaping status: Never Used  Substance and Sexual Activity   Alcohol use: Yes    Alcohol/week: 3.0 standard drinks of alcohol    Types: 3 Cans of beer per week    Comment: socially   Drug use: No   Sexual activity: Not Currently    Birth control/protection: None  Other Topics Concern   Not on file  Social History Narrative   6 grade school teacher   Social Drivers of Health   Financial Resource Strain: Low Risk  (12/23/2023)   Received from [facility]   Overall Financial Resource Strain (CARDIA)    Difficulty of Paying Living Expenses: Not very hard  Food Insecurity: No Food Insecurity (12/23/2023)   Received from [facility]   Hunger Vital Sign    Worried About Running Out of Food in the Last Year: Never true    Ran Out of Food in the Last Year: Never true  Transportation Needs: No Transportation Needs (12/23/2023)   Received from [facility] - Transportation    Lack of Transportation (Medical): No    Lack of  Transportation (Non-Medical): No  Physical Activity: Insufficiently Active (12/23/2023)   [facility] from [facility]   Exercise Vital Sign    Days of Exercise per Week: 2 days    Minutes of Exercise per Session: 30 min  Stress: Stress Concern Present (12/23/2023)   Received from [facility] Occupational [facility] - Occupational Stress Questionnaire    Feeling of Stress : To some extent  Social Connections: Moderately Integrated (12/23/2023)   Received from [facility]   Social Connection and Isolation Panel [NHANES]    Frequency of Communication with Friends and Family: More than three times a week    Frequency of Social Gatherings with Friends and Family: More than three times a week    Attends Religious Services: More than 4 times per year    Active Member of [employer] or Organizations:  Yes    Attends Club or Organization Meetings: More than 4 times per year    Marital Status: Divorced  Intimate Partner Violence: Not At Risk (12/23/2023)   Received from [facility]   Humiliation, Afraid, Rape, and Kick questionnaire    Fear of Current or Ex-Partner: No    Emotionally Abused: No    Physically Abused: No    Sexually Abused: No    FAMILY HISTORY: Family History  Problem Relation Age of Onset   Cancer Maternal Aunt        breast   Breast cancer Maternal Aunt 30    ALLERGIES:  is allergic to beef-derived drug products, chicken protein, fish-derived products, and pork-derived products.  MEDICATIONS:  Current Outpatient Medications  Medication Sig Dispense Refill   amLODipine  (NORVASC ) 2.5 MG tablet TAKE 1 TABLET BY MOUTH EVERY DAY 30 tablet 0   Cholecalciferol (VITAMIN D3) 1.25 MG (50000 UT) CAPS      Ferrous Sulfate (SLOW RELEASE IRON) 50 MG TBCR Take by mouth.     Multiple Vitamin (MULTIVITAMIN) capsule Take 1 capsule by mouth daily.     naproxen  (NAPROSYN ) 500 MG tablet Take by mouth.     No current facility-administered medications for this  visit.    Review of Systems  Constitutional:  Negative for appetite change, chills, fatigue and fever.  HENT:   Negative for hearing loss and voice change.   Eyes:  Negative for eye problems.  Respiratory:  Negative for chest tightness, cough and shortness of breath.   Cardiovascular:  Negative for chest pain and palpitations.  Gastrointestinal:  Negative for abdominal distention, abdominal pain and blood in stool.  Endocrine: Negative for hot flashes.  Genitourinary:  Negative for difficulty urinating and frequency.   Musculoskeletal:  Negative for arthralgias.  Skin:  Negative for itching and rash.  Neurological:  Negative for extremity weakness.  Hematological:  Negative for adenopathy.  Psychiatric/Behavioral:  Negative for confusion.    PHYSICAL EXAMINATION:  Vitals:   01/03/24 1103 01/03/24 1113  BP: (!) 148/109 (!) 146/109  Pulse: 75   Resp: 18   Temp: (!) 96.8 F (36 C)   SpO2: 98%    Filed Weights   01/03/24 1103  Weight: 222 lb 3.2 oz (100.8 kg)    Physical Exam Constitutional:      General: She is not in acute distress. HENT:     Head: Normocephalic and atraumatic.  Eyes:     General: No scleral icterus. Cardiovascular:     Rate and Rhythm: Tachycardia present.  Pulmonary:     Effort: Pulmonary effort is normal. No respiratory distress.     Breath sounds: No wheezing.  Abdominal:     General: Bowel sounds are normal. There is no distension.     Palpations: Abdomen is soft.  Musculoskeletal:        General: No deformity. Normal range of motion.     Cervical back: Normal range of motion and neck supple.     Right lower leg: No edema.     Left lower leg: No edema.  Skin:    General: Skin is warm and dry.     Findings: No erythema or rash.  Neurological:     Mental Status: She is alert and oriented to person, place, and time. Mental status is at baseline.     Cranial Nerves: No cranial nerve deficit.  Psychiatric:        Mood and Affect: Mood  normal.  LABORATORY DATA:  I have reviewed the data as listed    Latest Ref Rng & Units 12/13/2023    3:31 PM 06/07/2023    3:39 PM 05/17/2023    4:09 PM  CBC  WBC 4.0 - 10.5 K/uL 8.0  9.5  8.5   Hemoglobin 12.0 - 15.0 g/dL 40.9  81.1  91.4   Hematocrit 36.0 - 46.0 % 40.3  41.8  45.1   Platelets 150 - 400 K/uL 334  326  410       Latest Ref Rng & Units 05/09/2023    8:03 AM 12/21/2022   11:55 AM 10/12/2022    8:41 AM  CMP  Glucose 65 - 99 mg/dL 83  98  93   BUN 7 - 25 mg/dL 8  13  6    Creatinine 0.50 - 0.97 mg/dL 7.82  9.56  2.13   Sodium 135 - 146 mmol/L 137  135  136   Potassium 3.5 - 5.3 mmol/L 4.4  4.0  4.1   Chloride 98 - 110 mmol/L 102  102  103   CO2 20 - 32 mmol/L 23  24  24    Calcium 8.6 - 10.2 mg/dL 08.6  9.2  9.1   Total Protein 6.1 - 8.1 g/dL 8.2     Total Bilirubin 0.2 - 1.2 mg/dL 0.5     AST 10 - 30 U/L 22     ALT 6 - 29 U/L 21         RADIOGRAPHIC STUDIES: I have personally reviewed the radiological images as listed and agreed with the findings in the report. No results found.

## 2024-01-03 NOTE — Assessment & Plan Note (Addendum)
 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 Add otc vitamin C supplementation.

## 2024-01-03 NOTE — Assessment & Plan Note (Signed)
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  Parents with alpha thalassemia trait can pass it on to their children, so I suggest that her family members undergo screening and consider genetic counseling if they are planning to start a family.

## 2024-02-03 ENCOUNTER — Other Ambulatory Visit: Payer: Self-pay | Admitting: Nurse Practitioner

## 2024-02-03 DIAGNOSIS — I10 Essential (primary) hypertension: Secondary | ICD-10-CM

## 2024-02-04 NOTE — Telephone Encounter (Signed)
 Requested medication (s) are due for refill today: yes  Requested medication (s) are on the active medication list: yes  Last refill:  11/07/23 #30 3 RF  Future visit scheduled: no  Notes to clinic:  needs appointment. Last OV 05/09/23. Sent pt MyChart message to call and make appointment for refills.   Requested Prescriptions  Pending Prescriptions Disp Refills   amLODipine  (NORVASC ) 2.5 MG tablet [Pharmacy Med Name: AMLODIPINE  BESYLATE 2.5 MG TAB] 30 tablet 0    Sig: TAKE 1 TABLET BY MOUTH EVERY DAY     Cardiovascular: Calcium Channel Blockers 2 Failed - 02/04/2024  1:43 PM      Failed - Last BP in normal range    BP Readings from Last 1 Encounters:  01/03/24 (!) 146/109         Failed - Valid encounter within last 6 months    Recent Outpatient Visits   None            Passed - Last Heart Rate in normal range    Pulse Readings from Last 1 Encounters:  01/03/24 75

## 2025-01-07 ENCOUNTER — Other Ambulatory Visit

## 2025-01-10 ENCOUNTER — Ambulatory Visit: Admitting: Oncology
